# Patient Record
Sex: Male | Born: 1970
Health system: Southern US, Community
[De-identification: ages and names within clinical notes are randomized; demographics above are authoritative.]

## PROBLEM LIST (undated history)

## (undated) DIAGNOSIS — E785 Hyperlipidemia, unspecified: Secondary | ICD-10-CM

## (undated) DIAGNOSIS — F419 Anxiety disorder, unspecified: Secondary | ICD-10-CM

## (undated) DIAGNOSIS — I10 Essential (primary) hypertension: Secondary | ICD-10-CM

## (undated) HISTORY — DX: Hyperlipidemia, unspecified: E78.5

## (undated) HISTORY — DX: Essential (primary) hypertension: I10

## (undated) HISTORY — DX: Anxiety disorder, unspecified: F41.9

## (undated) HISTORY — PX: ABLATION OF DYSRHYTHMIC FOCUS: SHX254

---

## 1998-08-09 HISTORY — PX: OTHER SURGICAL HISTORY: SHX169

## 2004-02-23 ENCOUNTER — Ambulatory Visit: Payer: Self-pay | Admitting: Internal Medicine

## 2004-03-04 ENCOUNTER — Ambulatory Visit: Payer: Self-pay | Admitting: Family Medicine

## 2004-08-05 ENCOUNTER — Ambulatory Visit: Payer: Self-pay | Admitting: Family Medicine

## 2004-10-07 ENCOUNTER — Ambulatory Visit: Payer: Self-pay | Admitting: Family Medicine

## 2005-04-07 ENCOUNTER — Ambulatory Visit: Payer: Self-pay | Admitting: Family Medicine

## 2005-10-06 ENCOUNTER — Ambulatory Visit: Payer: Self-pay | Admitting: Family Medicine

## 2006-01-10 ENCOUNTER — Ambulatory Visit: Payer: Self-pay | Admitting: Family Medicine

## 2006-07-06 ENCOUNTER — Ambulatory Visit: Payer: Self-pay | Admitting: Family Medicine

## 2006-07-06 LAB — CONVERTED CEMR LAB
AST: 21 units/L (ref 0–37)
Bilirubin, Direct: 0.1 mg/dL (ref 0.0–0.3)
Chloride: 106 meq/L (ref 96–112)
Eosinophils Absolute: 0.2 10*3/uL (ref 0.0–0.6)
Eosinophils Relative: 2.2 % (ref 0.0–5.0)
GFR calc non Af Amer: 135 mL/min
Glucose, Bld: 91 mg/dL (ref 70–99)
HCT: 46.5 % (ref 39.0–52.0)
Lymphocytes Relative: 18.4 % (ref 12.0–46.0)
MCV: 100.8 fL — ABNORMAL HIGH (ref 78.0–100.0)
Neutro Abs: 5.9 10*3/uL (ref 1.4–7.7)
Neutrophils Relative %: 69.7 % (ref 43.0–77.0)
RBC: 4.61 M/uL (ref 4.22–5.81)
Sodium: 143 meq/L (ref 135–145)
TSH: 0.9 microintl units/mL (ref 0.35–5.50)
Total Protein: 7.1 g/dL (ref 6.0–8.3)
WBC: 8.5 10*3/uL (ref 4.5–10.5)

## 2006-12-03 DIAGNOSIS — F17200 Nicotine dependence, unspecified, uncomplicated: Secondary | ICD-10-CM

## 2006-12-03 DIAGNOSIS — I10 Essential (primary) hypertension: Secondary | ICD-10-CM | POA: Insufficient documentation

## 2006-12-03 DIAGNOSIS — E785 Hyperlipidemia, unspecified: Secondary | ICD-10-CM | POA: Insufficient documentation

## 2006-12-03 DIAGNOSIS — Z720 Tobacco use: Secondary | ICD-10-CM | POA: Insufficient documentation

## 2006-12-03 DIAGNOSIS — F528 Other sexual dysfunction not due to a substance or known physiological condition: Secondary | ICD-10-CM | POA: Insufficient documentation

## 2006-12-03 DIAGNOSIS — F411 Generalized anxiety disorder: Secondary | ICD-10-CM | POA: Insufficient documentation

## 2007-04-08 ENCOUNTER — Ambulatory Visit: Payer: Self-pay | Admitting: Family Medicine

## 2007-05-10 ENCOUNTER — Ambulatory Visit: Payer: Self-pay | Admitting: Family Medicine

## 2007-07-05 ENCOUNTER — Encounter (INDEPENDENT_AMBULATORY_CARE_PROVIDER_SITE_OTHER): Payer: Self-pay | Admitting: *Deleted

## 2007-07-09 ENCOUNTER — Encounter (INDEPENDENT_AMBULATORY_CARE_PROVIDER_SITE_OTHER): Payer: Self-pay | Admitting: *Deleted

## 2007-08-16 ENCOUNTER — Ambulatory Visit: Payer: Self-pay | Admitting: Family Medicine

## 2007-08-19 ENCOUNTER — Encounter (INDEPENDENT_AMBULATORY_CARE_PROVIDER_SITE_OTHER): Payer: Self-pay | Admitting: *Deleted

## 2007-08-19 ENCOUNTER — Telehealth (INDEPENDENT_AMBULATORY_CARE_PROVIDER_SITE_OTHER): Payer: Self-pay | Admitting: Internal Medicine

## 2007-08-19 LAB — CONVERTED CEMR LAB
BUN: 5 mg/dL — ABNORMAL LOW (ref 6–23)
CO2: 30 meq/L (ref 19–32)
Chloride: 106 meq/L (ref 96–112)
Direct LDL: 143.8 mg/dL
Glucose, Bld: 95 mg/dL (ref 70–99)
HDL: 37.5 mg/dL — ABNORMAL LOW (ref >39.0)
Potassium: 4.1 meq/L (ref 3.5–5.1)

## 2007-09-16 ENCOUNTER — Telehealth (INDEPENDENT_AMBULATORY_CARE_PROVIDER_SITE_OTHER): Payer: Self-pay | Admitting: Internal Medicine

## 2008-02-27 ENCOUNTER — Telehealth (INDEPENDENT_AMBULATORY_CARE_PROVIDER_SITE_OTHER): Payer: Self-pay | Admitting: Internal Medicine

## 2008-04-17 ENCOUNTER — Ambulatory Visit: Payer: Self-pay | Admitting: Family Medicine

## 2008-04-27 LAB — CONVERTED CEMR LAB
ALT: 16 units/L (ref 0–53)
Albumin: 4 g/dL (ref 3.5–5.2)
BUN: 13 mg/dL (ref 6–23)
Calcium: 9.2 mg/dL (ref 8.4–10.5)
Cholesterol: 187 mg/dL (ref 0–200)
GFR calc Af Amer: 107 mL/min
Glucose, Bld: 89 mg/dL (ref 70–99)
HDL: 41.7 mg/dL (ref >39.0)
Total Protein: 6.8 g/dL (ref 6.0–8.3)
Triglycerides: 104 mg/dL (ref 0–149)
VLDL: 21 mg/dL (ref 0–40)

## 2008-04-30 ENCOUNTER — Telehealth (INDEPENDENT_AMBULATORY_CARE_PROVIDER_SITE_OTHER): Payer: Self-pay | Admitting: Internal Medicine

## 2008-10-16 ENCOUNTER — Ambulatory Visit: Payer: Self-pay | Admitting: Family Medicine

## 2009-06-24 ENCOUNTER — Telehealth: Payer: Self-pay | Admitting: Family Medicine

## 2009-06-25 ENCOUNTER — Ambulatory Visit: Payer: Self-pay | Admitting: Family Medicine

## 2009-07-01 LAB — CONVERTED CEMR LAB
AST: 25 units/L (ref 0–37)
Albumin: 4.1 g/dL (ref 3.5–5.2)
BUN: 9 mg/dL (ref 6–23)
CO2: 30 meq/L (ref 19–32)
Calcium: 9.3 mg/dL (ref 8.4–10.5)
Direct LDL: 154.8 mg/dL
GFR calc non Af Amer: 113.47 mL/min (ref >60)
Glucose, Bld: 91 mg/dL (ref 70–99)
HDL: 56.8 mg/dL (ref >39.00)
Potassium: 4.4 meq/L (ref 3.5–5.1)
Total Protein: 7.3 g/dL (ref 6.0–8.3)
Triglycerides: 86 mg/dL (ref 0.0–149.0)

## 2009-09-24 ENCOUNTER — Ambulatory Visit: Payer: Self-pay | Admitting: Family Medicine

## 2009-09-24 LAB — CONVERTED CEMR LAB
ALT: 20 units/L (ref 0–53)
AST: 24 units/L (ref 0–37)
HDL: 55 mg/dL (ref >39.00)

## 2009-11-11 ENCOUNTER — Encounter (INDEPENDENT_AMBULATORY_CARE_PROVIDER_SITE_OTHER): Payer: Self-pay | Admitting: *Deleted

## 2009-12-24 ENCOUNTER — Telehealth: Payer: Self-pay | Admitting: Family Medicine

## 2010-02-04 ENCOUNTER — Ambulatory Visit: Payer: Self-pay | Admitting: Family Medicine

## 2010-03-18 ENCOUNTER — Ambulatory Visit: Payer: Self-pay | Admitting: Family Medicine

## 2010-03-21 LAB — CONVERTED CEMR LAB
ALT: 26 units/L (ref 0–53)
AST: 26 units/L (ref 0–37)
Albumin: 4.2 g/dL (ref 3.5–5.2)
Cholesterol: 210 mg/dL — ABNORMAL HIGH (ref 0–200)
Direct LDL: 143.4 mg/dL
HDL: 47.3 mg/dL (ref >39.00)
Total Bilirubin: 0.6 mg/dL (ref 0.3–1.2)
Total Protein: 7.2 g/dL (ref 6.0–8.3)
Triglycerides: 144 mg/dL (ref 0.0–149.0)

## 2010-05-10 NOTE — Assessment & Plan Note (Signed)
Summary: 30 MIN. F/U/BILLIE'S PT/CLE   Vital Signs:  Patient profile:   40 year old male Height:      70.5 inches Weight:      164.25 pounds BMI:     23.32 Temp:     98.1 degrees F oral Pulse rate:   80 / minute Pulse rhythm:   regular BP sitting:   142 / 80  (left arm) Cuff size:   regular  Vitals Entered By: Delilah Shan CMA Duncan Dull) (June 25, 2009 9:02 AM) CC: 30 minute follow up (BDB)   History of Present Illness: 40 yo male new to me here for follow up HTN.  HTN- mildly elevated today.  Has not taken medications yet today.  On Quinaretic 20-12.5 mg daily and propranolol 80 mg - 3 tabs tid.  Not having any side effects.  No CP, SOB, HA, lower extremity edema, fatigue, or dizziness.  No blurred vision.  ED- has been on Levitra for years.  He is aware of possible interaction with beta blocker, never had any issues.  Current Medications (verified): 1)  Propranolol Hcl Cr 80 Mg  Cp24 (Propranolol Hcl) .... 3 Tabs Two Times A Day 2)  Quinaretic 20-12.5 Mg  Tabs (Quinapril-Hydrochlorothiazide) .... 2 Tablets Daily By Mouth in Am 3)  Levitra 20 Mg Tabs (Vardenafil Hcl) .Marland Kitchen.. 1 Tablet  Pior To Intercourse 4)  Aspirin 325 Mg  Tabs (Aspirin) .... One Daily 5)  Vitamin C 500 Mg  Tabs (Ascorbic Acid) .... One Daily  Allergies (verified): No Known Drug Allergies  Review of Systems      See HPI General:  Denies chills and fever. CV:  Denies chest pain or discomfort. Resp:  Denies shortness of breath. MS:  Denies muscle aches. Derm:  Denies rash. Psych:  Denies anxiety and depression.  Physical Exam  General:  alert, well-developed, well-nourished, and well-hydrated.   Mouth:  MMM Lungs:  normal respiratory effort, no intercostal retractions, no accessory muscle use, and normal breath sounds.   Heart:  normal rate, regular rhythm, and no murmur.   Extremities:  no edema either lower leg Neurologic:  alert & oriented X3 and gait normal.   Psych:  normally interactive and good  eye contact.     Impression & Recommendations:  Problem # 1:  HYPERTENSION (ICD-401.9) Assessment Deteriorated Mildly elevated today but has not yet taken meds.  Continue current meds.  Reviewed old records as he on an unusual regimen, appears to be difficult to control. His updated medication list for this problem includes:    Propranolol Hcl Cr 80 Mg Cp24 (Propranolol hcl) .Marland KitchenMarland KitchenMarland KitchenMarland Kitchen 3 tabs two times a day    Quinaretic 20-12.5 Mg Tabs (Quinapril-hydrochlorothiazide) .Marland Kitchen... 2 tablets daily by mouth in am  Orders: Venipuncture ([address]) TLB-BMP (Basic Metabolic Panel-BMET) (80048-METABOL)  Problem # 2:  ERECTILE DYSFUNCTION (ICD-302.72) Assessment: Unchanged Continue levitra, aware of interactions with betablockers. His updated medication list for this problem includes:    Levitra 20 Mg Tabs (Vardenafil hcl) .Marland Kitchen... 1 tablet  pior to intercourse  Complete Medication List: 1)  Propranolol Hcl Cr 80 Mg Cp24 (Propranolol hcl) .... 3 tabs two times a day 2)  Quinaretic 20-12.5 Mg Tabs (Quinapril-hydrochlorothiazide) .... 2 tablets daily by mouth in am 3)  Levitra 20 Mg Tabs (Vardenafil hcl) .Marland Kitchen.. 1 tablet  pior to intercourse 4)  Aspirin 325 Mg Tabs (Aspirin) .... One daily 5)  Vitamin C 500 Mg Tabs (Ascorbic acid) .... One daily  Other Orders: TLB-Lipid Panel (80061-LIPID) TLB-Hepatic/Liver Function  Pnl (80076-HEPATIC) Prescriptions: LEVITRA 20 MG TABS (VARDENAFIL HCL) 1 tablet  pior to intercourse  #12 x 0   Entered and Authorized by:   Ruthe Mannan MD   Signed by:   Ruthe Mannan MD on 06/25/2009   Method used:   Electronically to        [facility] [address] (retail)       [address]       [place], [place]  [address]       Ph: [phone]       Fax: [phone]   RxID:   [id number] PROPRANOLOL HCL CR 80 MG  CP24 (PROPRANOLOL HCL) 3 tabs two times a day  #180 x 6   Entered and Authorized by:   Ruthe Mannan MD   Signed by:   Ruthe Mannan MD on  06/25/2009   Method used:   Electronically to        [facility]* (retail)       [address]       [place], [place]  [address]       Ph: [phone]       Fax: [phone]   RxID:   [id number] QUINARETIC 20-12.5 MG  TABS (QUINAPRIL-HYDROCHLOROTHIAZIDE) 2 tablets daily by mouth in am  #60 x 6   Entered and Authorized by:   Ruthe Mannan MD   Signed by:   Ruthe Mannan MD on 06/25/2009   Method used:   Electronically to        [facility]* (retail)       [address]       [place], [place]  [address]       Ph: [phone]       Fax: [phone]   RxID:   [id number]   Current Allergies (reviewed today): No known allergies   Last HDL:  41.7 (04/17/2008 8:44:00 AM) HDL Next Due:  1 yr Last LDL:  125 (04/17/2008 8:44:00 AM) LDL Next Due:  1 yr

## 2010-05-10 NOTE — Letter (Signed)
Summary: Nadara Eaton letter  [facility]  [address] [place], [place] [address]   Phone: [phone]  Fax: [phone]       11/11/2009 MRN: [id number]  RISHI VICARIO [address] [place] [address] [place], [place]  [address]  Dear Mr. [facility] [place], and [place] announce the retirement of Arta Silence, M.D., from full-time practice at the [facility] office effective October 07, 2009 and his plans of returning part-time.  It is important to Dr. Hetty Ely and to our practice that you understand that [facility] has seven physicians in our office for your health care needs.  We will continue to offer the same exceptional care that you have today.    Dr. Hetty Ely has spoken to many of you about his plans for retirement and returning part-time in the fall.   We will continue to work with you through the transition to schedule appointments for you in the office and meet the high standards that [facility] [facility] is committed to.   Again, it is with great pleasure that we share the news that Dr. Hetty Ely will return to [facility] at [facility] in October of 2011 with a reduced schedule.    If you have any questions, or would like to request an appointment with one of our physicians, please call us at [phone] and press the option for Scheduling an appointment.  We take pleasure in providing you with excellent patient care and look forward to seeing you at your next office visit.  Our [facility] Physicians are:  Tillman Abide, M.D. Laurita Quint, M.D. Roxy Manns, M.D. Kerby Nora, M.D. Hannah Beat, M.D. Ruthe Mannan, M.D. We proudly welcomed Raechel Ache, M.D. and Eustaquio Boyden, M.D. to the practice in July/August 2011.  Sincerely,  [facility]

## 2010-05-10 NOTE — Progress Notes (Signed)
Summary: refill request for levitra  Phone Note Refill Request Message from:  Fax from Pharmacy  Refills Requested: Medication #1:  LEVITRA 20 MG TABS 1 tablet  pior to intercourse   Last Refilled: 09/14/2009 [facility] [address] [facility]  Initial call taken by: Lowella Petties CMA,  December 24, 2009 4:51 PM    Prescriptions: LEVITRA 20 MG TABS (VARDENAFIL HCL) 1 tablet  pior to intercourse  #12 x 0   Entered and Authorized by:   Ruthe Mannan MD   Signed by:   Ruthe Mannan MD on 12/26/2009   Method used:   Electronically to        [facility] [address] (retail)       [address]       [place], [place]  [address]       Ph: [phone]       Fax: [phone]   RxID:   [id number]

## 2010-05-10 NOTE — Letter (Signed)
Summary: Schaller Retirement letter  [facility] [place]  [address]   [place], [place] [address]   Phone: [phone]  Fax: [phone]       11/11/2009 MRN: [id number]  Suhaib Malter [address] [place], [place]  [address]  Dear Mr. Talamantez,  [facility] [place], and  announce the retirement of ROBERT N. SCHALLER, M.D., from full-time practice at the [place] office effective October 07, 2009 and his plans of returning part-time.  It is important to Dr. Schaller and to our practice that you understand that [facility] [place] has seven physicians in our office for your health care needs.  We will continue to offer the same exceptional care that you have today.    Dr. Schaller has spoken to many of you about his plans for retirement and returning part-time in the fall.   We will continue to work with you through the transition to schedule appointments for you in the office and meet the high standards that [facility] is committed to.   Again, it is with great pleasure that we share the news that Dr. Schaller will return to [facility] [place] in October of 2011 with a reduced schedule.    If you have any questions, or would like to request an appointment with one of our physicians, please call us at [phone] and press the option for Scheduling an appointment.  We take pleasure in providing you with excellent patient care and look forward to seeing you at your next office visit.  Our [place] Physicians are:  Richard Letvak, M.D. Robert Schaller, M.D. Marne Tower, M.D. Ajooni Karam Bedsole, M.D. Spencer Copland, M.D. Talia Aron, M.D. We proudly welcomed Shaw Duncan, M.D. and Javier Gutierrez, M.D. to the practice in July/August 2011.  Sincerely,  [facility] [place] 

## 2010-05-10 NOTE — Progress Notes (Signed)
Summary: Rx Levitra   Phone Note Refill Request Call back at [phone] Message from:  [facility] on June 24, 2009 9:26 AM  Refills Requested: Medication #1:  LEVITRA 20 MG TABS 1 tablet  pior to intercourse   Last Refilled: 04/02/2009 Received faxed refill request, patient has an appt with Dr. Dayton Martes on 06/25/2009.  Please advise.   Method Requested: Electronic Initial call taken by: Linde Gillis CMA Duncan Dull),  June 24, 2009 9:27 AM    Prescriptions: LEVITRA 20 MG TABS (VARDENAFIL HCL) 1 tablet  pior to intercourse  #12 x 0   Entered and Authorized by:   Ruthe Mannan MD   Signed by:   Linde Gillis CMA (AAMA) on 06/24/2009   Method used:   Electronically to        [facility]* (retail)       [address]       [place], [place]  [address]       Ph: [phone]       Fax: [phone]   RxID:   [id number] LEVITRA 20 MG TABS (VARDENAFIL HCL) 1 tablet  pior to intercourse  #12 x 0   Entered and Authorized by:   Ruthe Mannan MD   Signed by:   Ruthe Mannan MD on 06/24/2009   Method used:   Electronically to        [facility]* (retail)       [address]       [place], [place]  [address]       Ph: [phone]       Fax: [phone]   RxID:   [id number]

## 2010-05-10 NOTE — Assessment & Plan Note (Signed)
Summary: F/U/DLO   Vital Signs:  Patient profile:   40 year old male Height:      70.5 inches Weight:      160 pounds Temp:     98.6 degrees F oral Pulse rate:   80 / minute Pulse rhythm:   regular BP sitting:   140 / 90  (left arm) Cuff size:   regular  Vitals Entered By: Linde Gillis CMA Duncan Dull) (February 04, 2010 11:47 AM) CC: follow up, wants flu shot   History of Present Illness: 40 yo male here for follow up HTN.  HTN- mildly elevated today.  Has not taken medications yet today.  On Quinaretic 20-12.5 mg daily and propranolol 80 mg - 3 tabs tid.  Not having any side effects.  No CP, SOB, HA, lower extremity edema, fatigue, or dizziness.  No blurred vision.  ED- has been on Levitra for years.  He is aware of possible interaction with beta blocker, never had any issues.  HLD-  LDL was elevated to 154 in 06/2009.  Pt aware of his risk factors and knows that smoking negatively impacts his cholesterol.  Worked on his diet- limited fried foods and saturated fats.  Repeat lipid panel much improve in 09/2009- LDL was 138, TG and HDL were normal.    Current Medications (verified): 1)  Propranolol Hcl Cr 80 Mg  Cp24 (Propranolol Hcl) .... 3 Tabs Two Times A Day 2)  Quinaretic 20-12.5 Mg  Tabs (Quinapril-Hydrochlorothiazide) .... 2 Tablets Daily By Mouth in Am 3)  Levitra 20 Mg Tabs (Vardenafil Hcl) .Marland Kitchen.. 1 Tablet  Pior To Intercourse 4)  Aspirin 325 Mg  Tabs (Aspirin) .... One Daily 5)  Vitamin C 500 Mg  Tabs (Ascorbic Acid) .... One Daily  Allergies (verified): No Known Drug Allergies  Past History:  Past Medical History: Last updated: 12/03/2006 Hypertension Anxiety Hyperlipidemia  Past Surgical History: Last updated: 12/03/2006 renal u/s-- nml--5/00 focus neg  Family History: Last updated: 04/08/2007 Father: 62--back problems, colon polyps Mother: 60--HBP, ?elevated lipids, fibromyalgia Siblings: none    DM-0 MI- 0 CVA- 0 Prostate Cancer- 0 Breast Cancer-  Paunt Ovarian Cancer- 0 Uterine Cancer- 0 Colon Cancer-0 Drug/ ETOH Abuse-0 Depression- 0 HBP--0  Social History: Last updated: 04/08/2007 Marital Status: single, divorced Children: 2 daughters--16 and 13 Occupation: makes cigarette boxes--machine operater  Risk Factors: Alcohol Use: 2 (04/08/2007) Caffeine Use: 2 (04/17/2008) Exercise: yes (04/08/2007)  Risk Factors: Smoking Status: current (10/16/2008) Packs/Day: 4 cigs daily (10/16/2008) Cans of tobacco/wk: no (04/08/2007)  Review of Systems      See HPI General:  Denies malaise. Eyes:  Denies blurring. CV:  Denies chest pain or discomfort. Resp:  Denies shortness of breath.  Physical Exam  General:  alert, well-developed, well-nourished, and well-hydrated.   Head:  normocephalic and atraumatic.   Eyes:  vision grossly intact, pupils equal, pupils round, and pupils reactive to light.   Nose:  no external deformity.   Mouth:  good dentition.   Lungs:  normal respiratory effort, no intercostal retractions, no accessory muscle use, and normal breath sounds.   Heart:  normal rate, regular rhythm, and no murmur.   Extremities:  no edema either lower leg Psych:  normally interactive and good eye contact.     Impression & Recommendations:  Problem # 1:  HYPERTENSION (ICD-401.9) Assessment Deteriorated mildly elevated, advised to take his medicaiton when he gets home. Refilled meds as well. His updated medication list for this problem includes:    Propranolol Hcl Cr  80 Mg Cp24 (Propranolol hcl) .Marland KitchenMarland KitchenMarland KitchenMarland Kitchen 3 tabs two times a day    Quinaretic 20-12.5 Mg Tabs (Quinapril-hydrochlorothiazide) .Marland Kitchen... 2 tablets daily by mouth in am  Problem # 2:  HYPERLIPIDEMIA (ICD-272.4) Assessment: Improved Lab appointment scheduled for recheck lipids on 03/2010.  Complete Medication List: 1)  Propranolol Hcl Cr 80 Mg Cp24 (Propranolol hcl) .... 3 tabs two times a day 2)  Quinaretic 20-12.5 Mg Tabs (Quinapril-hydrochlorothiazide) .... 2  tablets daily by mouth in am 3)  Levitra 20 Mg Tabs (Vardenafil hcl) .Marland Kitchen.. 1 tablet  pior to intercourse 4)  Aspirin 325 Mg Tabs (Aspirin) .... One daily 5)  Vitamin C 500 Mg Tabs (Ascorbic acid) .... One daily  Patient Instructions: 1)  Great to see you. 2)  You have a lab appointment scheduled on 12/9 at 8:10 am.  Please come to this appointment fasting (no food or beverage other than water or black coffee after midnight). Prescriptions: LEVITRA 20 MG TABS (VARDENAFIL HCL) 1 tablet  pior to intercourse  #12 x 3   Entered and Authorized by:   Ruthe Mannan MD   Signed by:   Ruthe Mannan MD on 02/04/2010   Method used:   Electronically to        [facility]* (retail)       [address]       [place], [place]  [address]       Ph: [phone]       Fax: [phone]   RxID:   [id number] PROPRANOLOL HCL CR 80 MG  CP24 (PROPRANOLOL HCL) 3 tabs two times a day  #180 x 6   Entered and Authorized by:   Ruthe Mannan MD   Signed by:   Ruthe Mannan MD on 02/04/2010   Method used:   Electronically to        [facility]* (retail)       [address]       [place], [place]  [address]       Ph: [phone]       Fax: [phone]   RxID:   [id number] QUINARETIC 20-12.5 MG  TABS (QUINAPRIL-HYDROCHLOROTHIAZIDE) 2 tablets daily by mouth in am  #90 x 6   Entered and Authorized by:   Ruthe Mannan MD   Signed by:   Ruthe Mannan MD on 02/04/2010   Method used:   Electronically to        [facility]* (retail)       [address]       [place], [place]  [address]       Ph: [phone]       Fax: [phone]   RxID:   [id number]    Orders Added: 1)  Est. Patient Level III [[address]]    Current Allergies (reviewed today): No known allergies   Flu Vaccine Result Date:  02/04/2010 Flu Vaccine Result:  given Last HDL:  55.00 (09/24/2009 8:23:55 AM) HDL Next Due:  6 mo Last LDL:  125 (04/17/2008 8:44:00 AM) LDL Next Due:  6 mo  Appended Document: F/U/DLO     Allergies: No  Known Drug Allergies   Complete Medication List: 1)  Propranolol Hcl Cr 80 Mg Cp24 (Propranolol hcl) .... 3 tabs two times a day 2)  Quinaretic 20-12.5 Mg Tabs (Quinapril-hydrochlorothiazide) .... 2 tablets daily by mouth in am 3)  Levitra 20 Mg Tabs (Vardenafil hcl) .Marland Kitchen.. 1 tablet  pior to intercourse 4)  Aspirin 325 Mg Tabs (Aspirin) .... One daily 5)  Vitamin C 500 Mg Tabs (  Ascorbic acid) .... One daily  Other Orders: Admin 1st Vaccine ([address]) Flu Vaccine 87yrs + [phone])   Orders Added: 1)  Admin 1st Vaccine [90471] 2)  Flu Vaccine 25yrs + [[address]]  Flu Vaccine Consent Questions     Do you have a history of severe allergic reactions to this vaccine? no    Any prior history of allergic reactions to egg and/or gelatin? no    Do you have a sensitivity to the preservative Thimersol? no    Do you have a past history of Guillan-Barre Syndrome? no    Do you currently have an acute febrile illness? no    Have you ever had a severe reaction to latex? no    Vaccine information given and explained to patient? yes    Are you currently pregnant? no    Lot Number:[id number]   Exp Date:10/08/2010   Site Given  Left Deltoid IM       .[email or web address]

## 2010-07-08 ENCOUNTER — Other Ambulatory Visit: Payer: Self-pay | Admitting: *Deleted

## 2010-07-08 MED ORDER — PROPRANOLOL HCL ER 80 MG PO CP24: ORAL_CAPSULE | ORAL | Status: DC

## 2010-08-18 ENCOUNTER — Other Ambulatory Visit: Payer: Self-pay | Admitting: *Deleted

## 2010-08-18 MED ORDER — VARDENAFIL HCL 20 MG PO TABS: ORAL_TABLET | ORAL | Status: DC

## 2010-08-26 NOTE — Assessment & Plan Note (Signed)
[facility]                                 ON-CALL NOTE   ANUSH, WIEDEMAN                      MRN:          [id number]  DATE:06/22/2007                            DOB:          1970/05/15    TIME:  5:55   SUBJECTIVE:  Ran out of blood pressure medicine.  Went to [facility] to have it refilled, but they are closed at 1, so he is  requesting a prescription sent to a pharmacy that is open to last him  through the weekend.   ASSESSMENT AND PLAN:  Prescription for Propranolol 80 mg, 3 tablets p.o.  b.i.d., #12, 0 refills, sent to [facility].     Kerby Nora, MD  Electronically Signed    AB/MedQ  DD: 06/22/2007  DT: 06/22/2007  Job #: [id number]

## 2010-09-16 ENCOUNTER — Ambulatory Visit: Payer: Self-pay | Admitting: Family Medicine

## 2010-10-07 ENCOUNTER — Encounter: Payer: Self-pay | Admitting: Family Medicine

## 2010-10-07 ENCOUNTER — Ambulatory Visit (INDEPENDENT_AMBULATORY_CARE_PROVIDER_SITE_OTHER): Payer: Managed Care, Other (non HMO) | Admitting: Family Medicine

## 2010-10-07 VITALS — BP 120/80 | HR 80 | Temp 98.0°F | Wt 147.5 lb

## 2010-10-07 DIAGNOSIS — I10 Essential (primary) hypertension: Secondary | ICD-10-CM

## 2010-10-07 DIAGNOSIS — E785 Hyperlipidemia, unspecified: Secondary | ICD-10-CM

## 2010-10-07 DIAGNOSIS — F411 Generalized anxiety disorder: Secondary | ICD-10-CM

## 2010-10-07 LAB — LIPID PANEL
HDL: 72 mg/dL (ref >39.00)
LDL Cholesterol: 80 mg/dL (ref 0–99)
Total CHOL/HDL Ratio: 2
Triglycerides: 68 mg/dL (ref 0.0–149.0)

## 2010-10-07 LAB — BASIC METABOLIC PANEL
CO2: 30 mEq/L (ref 19–32)
Calcium: 9.6 mg/dL (ref 8.4–10.5)
Chloride: 103 mEq/L (ref 96–112)
Sodium: 140 mEq/L (ref 135–145)

## 2010-10-07 NOTE — Progress Notes (Signed)
40 yo male here for follow up HTN.  HTN- Bp much improved today.  On Quinaretic 20-12.5 mg daily and propranolol 80 mg - 3 tabs tid.  Not having any side effects.  No CP, SOB, HA, lower extremity edema, fatigue, or dizziness.  No blurred vision. Lab Results  Component Value Date   CREATININE 0.8 06/25/2009    ED- has been on Levitra for years.  He is aware of possible interaction with beta blocker, never had any issues.  HLD-  LDL was elevated to 154 in 06/2009.  Pt aware of his risk factors and knows that smoking negatively impacts his cholesterol.  Worked on his diet- limited fried foods and saturated fats.  Repeat lipid panel much improve in 09/2009- LDL was 138, TG and HDL were normal.   Patient Active Problem List  Diagnoses  . HYPERLIPIDEMIA  . ANXIETY  . ERECTILE DYSFUNCTION  . NICOTINE ADDICTION  . HYPERTENSION   Past Medical History  Diagnosis Date  . Hypertension   . Anxiety   . Hyperlipidemia    Past Surgical History  Procedure Date  . Ablation of dysrhythmic focus     negative  . Renal ultrasound 08/1998    normal   History  Substance Use Topics  . Smoking status: Not on file  . Smokeless tobacco: Not on file  . Alcohol Use:    Family History  Problem Relation Age of Onset  . Hypertension Mother   . Hyperlipidemia Mother   . Fibromyalgia Mother   . Colon polyps Father   . Cancer Paternal Aunt     breast   Allergies not on file Current Outpatient Prescriptions on File Prior to Visit  Medication Sig Dispense Refill  . propranolol (INDERAL LA) 80 MG 24 hr capsule Take 3 capsules by mouth 2 times daily  180 capsule  2  . vardenafil (LEVITRA) 20 MG tablet 1 tablet  pior to intercourse  12 tablet  3     Review of Systems       See HPI General:  Denies malaise. Eyes:  Denies blurring. CV:  Denies chest pain or discomfort. Resp:  Denies shortness of breath.  Physical Exam BP 120/80  Pulse 80  Temp(Src) 98 F (36.7 C) (Oral)  Wt 147 lb 8 oz (66.906  kg)  General:  alert, well-developed, well-nourished, and well-hydrated.   Head:  normocephalic and atraumatic.   Eyes:  vision grossly intact, pupils equal, pupils round, and pupils reactive to light.   Nose:  no external deformity.   Mouth:  good dentition.   Lungs:  normal respiratory effort, no intercostal retractions, no accessory muscle use, and normal breath sounds.   Heart:  normal rate, regular rhythm, and no murmur.   Extremities:  no edema either lower leg Psych:  normally interactive and good eye contact.    1. HYPERTENSION  Improved. Continue Quinaretic 20-12.5 mg and propranolol 80 mg daily. Recheck BMET today.  2. HYPERLIPIDEMIA - Unchanged.  Recheck lipid panel today.   3. ANXIETY  Stable.

## 2010-10-07 NOTE — Patient Instructions (Signed)
Great to see you!   

## 2010-10-28 ENCOUNTER — Other Ambulatory Visit: Payer: Self-pay | Admitting: *Deleted

## 2010-10-28 MED ORDER — QUINAPRIL-HYDROCHLOROTHIAZIDE 20-12.5 MG PO TABS: 2.0000 | ORAL_TABLET | ORAL | Status: DC

## 2010-12-20 ENCOUNTER — Other Ambulatory Visit: Payer: Self-pay | Admitting: *Deleted

## 2010-12-20 NOTE — Telephone Encounter (Signed)
Yes it is ok but he needs to keep a close check on his BP.

## 2010-12-20 NOTE — Telephone Encounter (Signed)
Left message on pharmacy voicemail authorizing the change and for them to please not advise patient that he needs to keep close check on his BP.  Left a message on machine at home for patient to return call.

## 2010-12-20 NOTE — Telephone Encounter (Signed)
John Peters called back from [facility] and stated that they need new directions for Propranolol.  Patient will be going from extended release which he takes twice daily to immediate release which may need to be three times daily.   Please advise.

## 2010-12-20 NOTE — Telephone Encounter (Signed)
Received faxed from pharmacy requesting if they can change Generic Inderal LA which is $120 to IR Propranolol to decrease the cost?  Please advise.

## 2010-12-21 ENCOUNTER — Telehealth: Payer: Self-pay | Admitting: *Deleted

## 2010-12-21 NOTE — Telephone Encounter (Signed)
Patients dad called regarding patients Inderal Rx.  I advised him that we recently changed from extended release to immediate release.  Patients concern is that his insurance will no longer cover the cost of the Inderal Rx.  I advised dad to have patient call [facility] and find out how much the Rx would be not that we have changed it from XR to IR and let us know if he is still having trouble paying for the medication.  Dad will have patient call pharmacy and contact us for any additional questions regarding his medications.

## 2010-12-21 NOTE — Telephone Encounter (Signed)
Currently he should be taking Propranolol 80 mg- 3 capsules twice daily. Why don't we first try 2 capsules three times daily and have him come in for BP check on Friday.

## 2010-12-21 NOTE — Telephone Encounter (Signed)
Noted  

## 2010-12-21 NOTE — Telephone Encounter (Signed)
Rx sent to called to [facility], called patient and left a message on machine at home for patient to return call.

## 2011-02-16 ENCOUNTER — Telehealth: Payer: Self-pay | Admitting: *Deleted

## 2011-02-16 MED ORDER — LISINOPRIL-HYDROCHLOROTHIAZIDE 20-12.5 MG PO TABS: 1.0000 | ORAL_TABLET | Freq: Every day | ORAL | Status: DC

## 2011-02-16 NOTE — Telephone Encounter (Signed)
Yes.  rx sent.

## 2011-02-16 NOTE — Telephone Encounter (Signed)
Pt is asking if he can change from quinipril to lisinopril because of the cost.  Please advise.  Uses [facility].

## 2011-02-17 NOTE — Telephone Encounter (Signed)
Patient advised as instructed via telephone, Lisinopril sent to [facility].

## 2011-02-20 ENCOUNTER — Other Ambulatory Visit: Payer: Self-pay | Admitting: *Deleted

## 2011-02-20 MED ORDER — PROPRANOLOL HCL 80 MG PO TABS: ORAL_TABLET | ORAL | Status: DC

## 2011-02-20 NOTE — Telephone Encounter (Signed)
Rx filled electronically  °

## 2011-09-25 ENCOUNTER — Other Ambulatory Visit: Payer: Self-pay | Admitting: Family Medicine

## 2011-10-20 ENCOUNTER — Ambulatory Visit (INDEPENDENT_AMBULATORY_CARE_PROVIDER_SITE_OTHER): Payer: Managed Care, Other (non HMO) | Admitting: Family Medicine

## 2011-10-20 ENCOUNTER — Encounter: Payer: Self-pay | Admitting: Family Medicine

## 2011-10-20 VITALS — BP 142/82 | HR 84 | Temp 97.9°F | Wt 153.0 lb

## 2011-10-20 DIAGNOSIS — M6740 Ganglion, unspecified site: Secondary | ICD-10-CM

## 2011-10-20 MED ORDER — VARDENAFIL HCL 20 MG PO TABS: ORAL_TABLET | ORAL | Status: DC

## 2011-10-20 MED ORDER — PROPRANOLOL HCL 80 MG PO TABS: ORAL_TABLET | ORAL | Status: DC

## 2011-10-20 MED ORDER — LISINOPRIL-HYDROCHLOROTHIAZIDE 20-12.5 MG PO TABS: 1.0000 | ORAL_TABLET | Freq: Every day | ORAL | Status: DC

## 2011-10-20 NOTE — Progress Notes (Signed)
  Subjective:    Patient ID: John Peters, male    DOB: November 26, 1970, 41 y.o.   MRN: [id number]  HPI  41 yo here for lump on dorsal surface of his right hand. Not painful or red. Has been there for several weeks.  He is not sure if it is getting larger, but he is sure it is not getting smaller. No tingling or pain in digits. No decreased grip strength. Never had anything like this before.  Patient Active Problem List  Diagnosis  . HYPERLIPIDEMIA  . ANXIETY  . ERECTILE DYSFUNCTION  . NICOTINE ADDICTION  . HYPERTENSION  . Ganglion cyst   Past Medical History  Diagnosis Date  . Hypertension   . Anxiety   . Hyperlipidemia    Past Surgical History  Procedure Date  . Ablation of dysrhythmic focus     negative  . Renal ultrasound 08/1998    normal   History  Substance Use Topics  . Smoking status: Current Everyday Smoker  . Smokeless tobacco: Not on file  . Alcohol Use: Not on file   Family History  Problem Relation Age of Onset  . Hypertension Mother   . Hyperlipidemia Mother   . Fibromyalgia Mother   . Colon polyps Father   . Cancer Paternal Aunt     breast   No Known Allergies Current Outpatient Prescriptions on File Prior to Visit  Medication Sig Dispense Refill  . aspirin 325 MG tablet Take 325 mg by mouth daily.        . vitamin C (ASCORBIC ACID) 500 MG tablet Take 500 mg by mouth daily.        Marland Kitchen DISCONTD: lisinopril-hydrochlorothiazide (ZESTORETIC) 20-12.5 MG per tablet Take 1 tablet by mouth daily.  30 tablet  11  . DISCONTD: propranolol (INDERAL) 80 MG tablet TAKE 2 TABLETS BY MOUTH THREE TIMES A   DAY  180 tablet  0  . DISCONTD: vardenafil (LEVITRA) 20 MG tablet 1 tablet  pior to intercourse  12 tablet  3   The PMH, PSH, Social History, Family History, Medications, and allergies have been reviewed in [facility], and have been updated if relevant.   Review of Systems    See HPI No fever  Objective:   Physical Exam  BP 142/82  Pulse 84  Temp 97.9 F  (36.6 C)  Wt 153 lb (69.4 kg) General:  Pleasant male in NAD HEENT:normocephalic, atraumatic Extremities:  no edema   Freely movable nodule on dorsal aspect of right hand, non painful to palpation, no erythema or warmth Assessment & Plan:   1. Ganglion cyst  Ambulatory referral to Orthopedic Surgery   New- will refer to ortho for removal. The patient indicates understanding of these issues and agrees with the plan.

## 2011-10-20 NOTE — Patient Instructions (Addendum)
Good to see you. You have a cyst. Please stop by to see Shirlee Limerick on your way out to set up your referral.

## 2012-03-08 NOTE — Telephone Encounter (Signed)
Made in error

## 2012-05-28 ENCOUNTER — Other Ambulatory Visit: Payer: Self-pay | Admitting: *Deleted

## 2012-05-28 MED ORDER — PROPRANOLOL HCL 80 MG PO TABS: ORAL_TABLET | ORAL | Status: DC

## 2012-05-30 ENCOUNTER — Telehealth: Payer: Self-pay | Admitting: Family Medicine

## 2012-05-30 NOTE — Telephone Encounter (Signed)
John Peters  DOB- 1970-09-12, PCP- Ruthe Mannan (Family Practice)/  Calling in requesting a refill on (#1) Lisinopril HCTZ  20-12.5mg  take one tablet daily  (#2) Refill on Propanolol/Inderal 80mg  PLEASE VERIFY THE DOSAGE.  Patient states he takes 2 tablets in the morning and 2 tablets at night.  Epic review -conflicting - 2 tablets tid and  3 capsules by mouth bid ??  Unsure which is correct.  LOV 10/20/2011.  Patient needs an appointment and transferred to the office to schedule Physical  appointment.  Please contact patient regarding refill of medication- [phone].  [facility].

## 2012-05-30 NOTE — Telephone Encounter (Signed)
Please see note below.  Spoke with patient, he says he takes 2 propranolol two times a day.  States his blood pressure is fine, he monitors it at home.  Please advise on how he should be taking this.

## 2012-05-31 MED ORDER — LISINOPRIL-HYDROCHLOROTHIAZIDE 20-12.5 MG PO TABS: 1.0000 | ORAL_TABLET | Freq: Every day | ORAL | Status: DC

## 2012-05-31 MED ORDER — PROPRANOLOL HCL 80 MG PO TABS: ORAL_TABLET | ORAL | Status: DC

## 2012-05-31 NOTE — Telephone Encounter (Signed)
Please take 2 propranolol twice daily if that is how he has been doing it and please update dose correctly in Epic.

## 2012-06-14 ENCOUNTER — Ambulatory Visit: Payer: Managed Care, Other (non HMO) | Admitting: Family Medicine

## 2012-06-28 ENCOUNTER — Other Ambulatory Visit: Payer: Self-pay

## 2012-06-28 MED ORDER — PROPRANOLOL HCL 80 MG PO TABS: ORAL_TABLET | ORAL | Status: DC

## 2012-06-28 MED ORDER — LISINOPRIL-HYDROCHLOROTHIAZIDE 20-12.5 MG PO TABS: 1.0000 | ORAL_TABLET | Freq: Every day | ORAL | Status: DC
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
## 2012-07-12 ENCOUNTER — Ambulatory Visit (INDEPENDENT_AMBULATORY_CARE_PROVIDER_SITE_OTHER): Payer: Managed Care, Other (non HMO) | Admitting: Family Medicine

## 2012-07-12 ENCOUNTER — Encounter: Payer: Self-pay | Admitting: Family Medicine

## 2012-07-12 VITALS — BP 122/90 | HR 60 | Temp 97.7°F | Ht 70.5 in | Wt 155.0 lb

## 2012-07-12 DIAGNOSIS — I10 Essential (primary) hypertension: Secondary | ICD-10-CM

## 2012-07-12 DIAGNOSIS — F528 Other sexual dysfunction not due to a substance or known physiological condition: Secondary | ICD-10-CM

## 2012-07-12 DIAGNOSIS — E785 Hyperlipidemia, unspecified: Secondary | ICD-10-CM

## 2012-07-12 LAB — LIPID PANEL
LDL Cholesterol: 114 mg/dL — ABNORMAL HIGH (ref 0–99)
Triglycerides: 113 mg/dL (ref <150)

## 2012-07-12 LAB — COMPREHENSIVE METABOLIC PANEL
Albumin: 4.4 g/dL (ref 3.5–5.2)
Alkaline Phosphatase: 59 U/L (ref 39–117)
CO2: 26 mEq/L (ref 19–32)
Calcium: 9.1 mg/dL (ref 8.4–10.5)
Chloride: 105 mEq/L (ref 96–112)
Glucose, Bld: 75 mg/dL (ref 70–99)
Potassium: 4.2 mEq/L (ref 3.5–5.3)
Sodium: 139 mEq/L (ref 135–145)
Total Protein: 7 g/dL (ref 6.0–8.3)

## 2012-07-12 MED ORDER — LISINOPRIL-HYDROCHLOROTHIAZIDE 20-12.5 MG PO TABS: 1.0000 | ORAL_TABLET | Freq: Every day | ORAL | Status: DC

## 2012-07-12 MED ORDER — PROPRANOLOL HCL 80 MG PO TABS: ORAL_TABLET | ORAL | Status: DC

## 2012-07-12 MED ORDER — VARDENAFIL HCL 20 MG PO TABS: ORAL_TABLET | ORAL | Status: DC

## 2012-07-12 NOTE — Progress Notes (Signed)
42 yo male with h/o HTN, HLD and ED here for follow up.    HTN-  On Quinaretic 20-12.5 mg daily and propranolol 80 mg - 3 tabs tid.  Not having any side effects.  No CP, SOB, HA, lower extremity edema, fatigue, or dizziness.  No blurred vision. Lab Results  Component Value Date   CREATININE 0.7 10/07/2010    ED- has been on Levitra for years.  He is aware of possible interaction with beta blocker, never had any issues.  HLD-  LDL was elevated to 154 in 06/2009.  Pt aware of his risk factors and knows that smoking negatively impacts his cholesterol. He has been trying e cigarettes and trying to cut back.  Also taking fish oil.  Continues to work on his diet and lipids have improved.  Due for labs today.    Lab Results  Component Value Date   CHOL 166 10/07/2010   HDL 72.00 10/07/2010   LDLCALC 80 10/07/2010   LDLDIRECT 143.4 03/18/2010   TRIG 68.0 10/07/2010   CHOLHDL 2 10/07/2010      Patient Active Problem List  Diagnosis  . HYPERLIPIDEMIA  . ANXIETY  . ERECTILE DYSFUNCTION  . NICOTINE ADDICTION  . HYPERTENSION   Past Medical History  Diagnosis Date  . Hypertension   . Anxiety   . Hyperlipidemia    Past Surgical History  Procedure Laterality Date  . Ablation of dysrhythmic focus      negative  . Renal ultrasound  08/1998    normal   History  Substance Use Topics  . Smoking status: Current Every Day Smoker  . Smokeless tobacco: Not on file  . Alcohol Use: Not on file   Family History  Problem Relation Age of Onset  . Hypertension Mother   . Hyperlipidemia Mother   . Fibromyalgia Mother   . Colon polyps Father   . Cancer Paternal Aunt     breast   No Known Allergies Current Outpatient Prescriptions on File Prior to Visit  Medication Sig Dispense Refill  . aspirin 325 MG tablet Take 325 mg by mouth daily.        Marland Kitchen lisinopril-hydrochlorothiazide (ZESTORETIC) 20-12.5 MG per tablet Take 1 tablet by mouth daily.  30 tablet  0  . propranolol (INDERAL) 80 MG tablet  Take 2 tablets by mouth two times a day. * Needs appointment with Dr for additional refills*  120 tablet  0  . vardenafil (LEVITRA) 20 MG tablet 1 tablet  pior to intercourse  12 tablet  5  . vitamin C (ASCORBIC ACID) 500 MG tablet Take 500 mg by mouth daily.         No current facility-administered medications on file prior to visit.     Review of Systems       See HPI General:  Denies malaise. CV:  Denies chest pain or discomfort. Resp:  Denies shortness of breath.  Physical Exam BP 122/90  Pulse 60  Temp(Src) 97.7 F (36.5 C)  Ht 5' 10.5" (1.791 m)  Wt 155 lb (70.308 kg)  BMI 21.92 kg/m2  General:  alert, well-developed, well-nourished, and well-hydrated.   Head:  normocephalic and atraumatic.   Eyes:  vision grossly intact, pupils equal, pupils round, and pupils reactive to light.   Nose:  no external deformity.   Mouth:  good dentition.   Lungs:  normal respiratory effort, no intercostal retractions, no accessory muscle use, and normal breath sounds.   Heart:  normal rate, regular rhythm, and  no murmur.   Extremities:  no edema either lower leg Psych:  normally interactive and good eye contact.    Assessment and Plan: 1. HYPERTENSION Stable on current meds.    2. HYPERLIPIDEMIA Recheck today. Orders Placed This Encounter  Procedures  . Comprehensive metabolic panel  . Lipid Panel     3. ERECTILE DYSFUNCTION Symptoms controlled with as needed levitra.

## 2012-07-12 NOTE — Patient Instructions (Addendum)
Good to see you. We will call you with your lab results. Your blood pressure was great!

## 2012-07-15 ENCOUNTER — Encounter: Payer: Self-pay | Admitting: Family Medicine

## 2013-03-19 ENCOUNTER — Other Ambulatory Visit: Payer: Self-pay | Admitting: *Deleted

## 2013-03-19 MED ORDER — LISINOPRIL-HYDROCHLOROTHIAZIDE 20-12.5 MG PO TABS: 1.0000 | ORAL_TABLET | Freq: Every day | ORAL | Status: DC

## 2013-03-22 ENCOUNTER — Other Ambulatory Visit: Payer: Self-pay | Admitting: Family Medicine

## 2013-03-25 ENCOUNTER — Other Ambulatory Visit: Payer: Self-pay | Admitting: Family Medicine

## 2013-04-01 ENCOUNTER — Telehealth: Payer: Self-pay | Admitting: Family Medicine

## 2013-04-01 MED ORDER — PROPRANOLOL HCL 80 MG PO TABS: ORAL_TABLET | ORAL | Status: DC

## 2013-04-01 NOTE — Telephone Encounter (Signed)
Spoke with pt and has enough lisinopril hctz but needs refill propranolol to [facility]. Advised done andpt will keep 01/09/*15 visit.

## 2013-04-01 NOTE — Telephone Encounter (Signed)
Pt scheduled med refill for 04/18/13 with Dr. Dayton Martes but he states that he will run out of his b/p meds before then.

## 2013-04-18 ENCOUNTER — Ambulatory Visit (INDEPENDENT_AMBULATORY_CARE_PROVIDER_SITE_OTHER): Payer: Managed Care, Other (non HMO) | Admitting: Family Medicine

## 2013-04-18 ENCOUNTER — Encounter: Payer: Self-pay | Admitting: Family Medicine

## 2013-04-18 VITALS — BP 126/82 | HR 53 | Temp 97.6°F | Ht 71.0 in | Wt 158.8 lb

## 2013-04-18 DIAGNOSIS — F17200 Nicotine dependence, unspecified, uncomplicated: Secondary | ICD-10-CM

## 2013-04-18 DIAGNOSIS — I10 Essential (primary) hypertension: Secondary | ICD-10-CM

## 2013-04-18 DIAGNOSIS — F411 Generalized anxiety disorder: Secondary | ICD-10-CM

## 2013-04-18 DIAGNOSIS — E785 Hyperlipidemia, unspecified: Secondary | ICD-10-CM

## 2013-04-18 LAB — LIPID PANEL
Cholesterol: 206 mg/dL — ABNORMAL HIGH (ref 0–200)
HDL: 48.5 mg/dL (ref >39.00)
Total CHOL/HDL Ratio: 4
Triglycerides: 88 mg/dL (ref 0.0–149.0)
VLDL: 17.6 mg/dL (ref 0.0–40.0)

## 2013-04-18 LAB — COMPREHENSIVE METABOLIC PANEL
ALBUMIN: 3.9 g/dL (ref 3.5–5.2)
ALT: 11 U/L (ref 0–53)
AST: 17 U/L (ref 0–37)
Alkaline Phosphatase: 59 U/L (ref 39–117)
BUN: 7 mg/dL (ref 6–23)
CALCIUM: 8.7 mg/dL (ref 8.4–10.5)
CO2: 26 meq/L (ref 19–32)
Chloride: 105 mEq/L (ref 96–112)
Creatinine, Ser: 0.8 mg/dL (ref 0.4–1.5)
GFR: 114.73 mL/min (ref >60.00)
Glucose, Bld: 78 mg/dL (ref 70–99)
POTASSIUM: 4.2 meq/L (ref 3.5–5.1)
SODIUM: 138 meq/L (ref 135–145)
TOTAL PROTEIN: 7 g/dL (ref 6.0–8.3)
Total Bilirubin: 0.5 mg/dL (ref 0.3–1.2)

## 2013-04-18 LAB — LDL CHOLESTEROL, DIRECT: Direct LDL: 139.5 mg/dL

## 2013-04-18 MED ORDER — VARDENAFIL HCL 20 MG PO TABS: ORAL_TABLET | ORAL | Status: DC

## 2013-04-18 MED ORDER — PROPRANOLOL HCL 80 MG PO TABS: ORAL_TABLET | ORAL | Status: DC

## 2013-04-18 MED ORDER — LISINOPRIL-HYDROCHLOROTHIAZIDE 20-12.5 MG PO TABS: 1.0000 | ORAL_TABLET | Freq: Every day | ORAL | Status: DC

## 2013-04-18 NOTE — Assessment & Plan Note (Signed)
Still smoking Not ready to quit 

## 2013-04-18 NOTE — Progress Notes (Signed)
43 yo male with h/o HTN, HLD and ED here for follow up.    HTN-  On Quinaretic 20-12.5 mg daily and propranolol 80 mg - 3 tabs tid.  Not having any side effects.  No CP, SOB, HA, lower extremity edema, fatigue, or dizziness.  No blurred vision. Lab Results  Component Value Date   CREATININE 0.71 07/12/2012    HLD-  .  Pt aware of his risk factors and knows that smoking negatively impacts his cholesterol. He has been trying e cigarettes and trying to cut back.  Also taking fish oil.  Continues to work on his diet and lipids have improved.  Wt Readings from Last 3 Encounters:  04/18/13 158 lb 12 oz (72.009 kg)  07/12/12 155 lb (70.308 kg)  10/20/11 153 lb (69.4 kg)     Lab Results  Component Value Date   CHOL 187 07/12/2012   HDL 50 07/12/2012   LDLCALC 114* 07/12/2012   LDLDIRECT 143.4 03/18/2010   TRIG 113 07/12/2012   CHOLHDL 3.7 07/12/2012      Patient Active Problem List   Diagnosis Date Noted  . HYPERLIPIDEMIA 12/03/2006  . ANXIETY 12/03/2006  . ERECTILE DYSFUNCTION 12/03/2006  . NICOTINE ADDICTION 12/03/2006  . HYPERTENSION 12/03/2006   Past Medical History  Diagnosis Date  . Hypertension   . Anxiety   . Hyperlipidemia    Past Surgical History  Procedure Laterality Date  . Ablation of dysrhythmic focus      negative  . Renal ultrasound  08/1998    normal   History  Substance Use Topics  . Smoking status: Current Every Day Smoker  . Smokeless tobacco: Not on file  . Alcohol Use: Not on file   Family History  Problem Relation Age of Onset  . Hypertension Mother   . Hyperlipidemia Mother   . Fibromyalgia Mother   . Colon polyps Father   . Cancer Paternal Aunt     breast   No Known Allergies Current Outpatient Prescriptions on File Prior to Visit  Medication Sig Dispense Refill  . aspirin 325 MG tablet Take 325 mg by mouth daily.        . Omega-3 Fatty Acids (FISH OIL) 1000 MG CAPS Take one by mouth daily      . vitamin C (ASCORBIC ACID) 500 MG tablet Take 500  mg by mouth daily.         No current facility-administered medications on file prior to visit.     Review of Systems       See HPI General:  Denies malaise. CV:  Denies chest pain or discomfort. Resp:  Denies shortness of breath.  Physical Exam BP 126/82  Pulse 53  Temp(Src) 97.6 F (36.4 C) (Oral)  Ht 5\' 11"  (1.803 m)  Wt 158 lb 12 oz (72.009 kg)  BMI 22.15 kg/m2  SpO2 97%  General:  alert, well-developed, well-nourished, and well-hydrated.   Head:  normocephalic and atraumatic.   Eyes:  vision grossly intact, pupils equal, pupils round, and pupils reactive to light.   Nose:  no external deformity.   Mouth:  good dentition.   Lungs:  normal respiratory effort, no intercostal retractions, no accessory muscle use, and normal breath sounds.   Heart:  normal rate, regular rhythm, and no murmur.   Extremities:  no edema either lower leg Psych:  normally interactive and good eye contact.

## 2013-04-18 NOTE — Patient Instructions (Signed)
Good to see you. I will call you with your lab results.   

## 2013-04-18 NOTE — Progress Notes (Signed)
Pre-visit discussion using our clinic review tool. No additional management support is needed unless otherwise documented below in the visit note.  

## 2013-04-18 NOTE — Assessment & Plan Note (Signed)
Well controlled.  No changes.  Rx refilled.

## 2013-04-18 NOTE — Assessment & Plan Note (Signed)
Recheck labs today. Orders Placed This Encounter  Procedures  . Comprehensive metabolic panel  . Lipid panel   No changes.

## 2013-04-21 ENCOUNTER — Encounter: Payer: Self-pay | Admitting: *Deleted

## 2013-07-05 ENCOUNTER — Other Ambulatory Visit: Payer: Self-pay | Admitting: Family Medicine

## 2014-01-22 ENCOUNTER — Ambulatory Visit (INDEPENDENT_AMBULATORY_CARE_PROVIDER_SITE_OTHER): Payer: Managed Care, Other (non HMO) | Admitting: Family Medicine

## 2014-01-22 ENCOUNTER — Encounter: Payer: Self-pay | Admitting: Family Medicine

## 2014-01-22 VITALS — BP 128/82 | HR 79 | Temp 97.7°F | Wt 171.2 lb

## 2014-01-22 DIAGNOSIS — I10 Essential (primary) hypertension: Secondary | ICD-10-CM

## 2014-01-22 DIAGNOSIS — E785 Hyperlipidemia, unspecified: Secondary | ICD-10-CM

## 2014-01-22 DIAGNOSIS — F411 Generalized anxiety disorder: Secondary | ICD-10-CM

## 2014-01-22 MED ORDER — LISINOPRIL-HYDROCHLOROTHIAZIDE 20-12.5 MG PO TABS: 1.0000 | ORAL_TABLET | Freq: Every day | ORAL | Status: DC

## 2014-01-22 MED ORDER — VARDENAFIL HCL 20 MG PO TABS: ORAL_TABLET | ORAL | Status: DC

## 2014-01-22 NOTE — Progress Notes (Signed)
43 yo male with h/o HTN, HLD and ED here for follow up.    He has no health insurance right now, would prefer to not do labs today.  HTN-  On Quinaretic 20-12.5 mg daily and propranolol 80 mg - 3 tabs tid.  Not having any side effects.  No CP, SOB, HA, lower extremity edema, fatigue, or dizziness.  No blurred vision. Lab Results  Component Value Date   CREATININE 0.8 04/18/2013    HLD-  .  Pt aware of his risk factors and knows that smoking negatively impacts his cholesterol. Wt Readings from Last 3 Encounters:  01/22/14 171 lb 4 oz (77.678 kg)  04/18/13 158 lb 12 oz (72.009 kg)  07/12/12 155 lb (70.308 kg)     Lab Results  Component Value Date   CHOL 206* 04/18/2013   HDL 48.50 04/18/2013   LDLCALC 114* 07/12/2012   LDLDIRECT 139.5 04/18/2013   TRIG 88.0 04/18/2013   CHOLHDL 4 04/18/2013      Patient Active Problem List   Diagnosis Date Noted  . HTN (hypertension) 12/03/2006  . Anxiety state 12/03/2006  . ERECTILE DYSFUNCTION 12/03/2006  . NICOTINE ADDICTION 12/03/2006  . HLD (hyperlipidemia) 12/03/2006   Past Medical History  Diagnosis Date  . Hypertension   . Anxiety   . Hyperlipidemia    Past Surgical History  Procedure Laterality Date  . Ablation of dysrhythmic focus      negative  . Renal ultrasound  08/1998    normal   History  Substance Use Topics  . Smoking status: Current Every Day Smoker  . Smokeless tobacco: Not on file  . Alcohol Use: Not on file   Family History  Problem Relation Age of Onset  . Hypertension Mother   . Hyperlipidemia Mother   . Fibromyalgia Mother   . Colon polyps Father   . Cancer Paternal Aunt     breast   No Known Allergies Current Outpatient Prescriptions on File Prior to Visit  Medication Sig Dispense Refill  . aspirin 325 MG tablet Take 325 mg by mouth daily.        . Omega-3 Fatty Acids (FISH OIL) 1000 MG CAPS Take one by mouth daily      . propranolol (INDERAL) 80 MG tablet TAKE TWO (2) TABLETS BY MOUTH 2 TIMES DAILY  360  tablet  2  . vitamin C (ASCORBIC ACID) 500 MG tablet Take 500 mg by mouth daily.         No current facility-administered medications on file prior to visit.     Review of Systems       See HPI General:  Denies malaise. CV:  Denies chest pain or discomfort.   Physical Exam BP 128/82  Pulse 79  Temp(Src) 97.7 F (36.5 C) (Oral)  Wt 171 lb 4 oz (77.678 kg)  SpO2 99%  General:  alert, well-developed, well-nourished, and well-hydrated.   Head:  normocephalic and atraumatic.   Eyes:  vision grossly intact, pupils equal, pupils round, and pupils reactive to light.   Nose:  no external deformity.   Mouth:  good dentition.   Lungs:  normal respiratory effort, no intercostal retractions, no accessory muscle use, and normal breath sounds.   Heart:  normal rate, regular rhythm, and no murmur.   Extremities:  no edema either lower leg Psych:  normally interactive and good eye contact.

## 2014-01-22 NOTE — Progress Notes (Signed)
Pre visit review using our clinic review tool, if applicable. No additional management support is needed unless otherwise documented below in the visit note. 

## 2014-01-22 NOTE — Assessment & Plan Note (Signed)
Well controlled. Rx ok to refill. Will follow up with me when he gets health insurance- he plans to get it by next month. The patient indicates understanding of these issues and agrees with the plan.

## 2014-01-23 ENCOUNTER — Telehealth: Payer: Self-pay | Admitting: Family Medicine

## 2014-01-23 NOTE — Telephone Encounter (Signed)
emmi mailed  °

## 2014-03-13 ENCOUNTER — Telehealth: Payer: Self-pay

## 2014-03-13 NOTE — Telephone Encounter (Signed)
Pt left v/m requesting rx for sildenafil instead of Levitra due to cost; Please advise. Pt request cb.[facility].

## 2014-03-16 MED ORDER — SILDENAFIL CITRATE 50 MG PO TABS: 50.0000 mg | ORAL_TABLET | Freq: Every day | ORAL | Status: DC | PRN

## 2014-03-16 NOTE — Telephone Encounter (Signed)
John Peters, can you call [facility] to see what their deal is for viagra.

## 2014-03-16 NOTE — Telephone Encounter (Signed)
50 for $80 cash only, but Rx must say sildenafil, not viagra

## 2014-03-16 NOTE — Telephone Encounter (Signed)
eRx sent

## 2014-03-20 ENCOUNTER — Telehealth: Payer: Self-pay

## 2014-03-20 NOTE — Telephone Encounter (Signed)
Pt left v/m requesting refill of med and requested cb. Left v/m for pt to return call; pt did not leave name of medication wanting refill of. [facility].

## 2014-03-20 NOTE — Telephone Encounter (Signed)
Pt wants sildenafil not viagra sent to [facility]; spoke with Pilar Plate at [place] and was advised Viagra cannot be on prescription for pt to get the sildenafil # 50 for $ 80.00 so the rx sent on 03/16/14 cannot be used. Pt said next week would be OK and request cb when sent to pharmacy.

## 2014-03-23 MED ORDER — SILDENAFIL CITRATE 50 MG PO TABS: 50.0000 mg | ORAL_TABLET | Freq: Every day | ORAL | Status: DC | PRN

## 2014-03-23 NOTE — Telephone Encounter (Signed)
That's what I thought I did, please call in rx. Thanks.

## 2014-03-26 ENCOUNTER — Telehealth: Payer: Self-pay

## 2014-03-26 NOTE — Telephone Encounter (Signed)
Pt left v/m requesting cb about med change; left v/m requesting pt cb to office. [facility].

## 2014-03-31 NOTE — Telephone Encounter (Signed)
Pt said [facility] still said viagra was sent in; spoke with Pilar Plate at [facility]; Pilar Plate said sildenafil 20 mg # 79 with instructions by Dr Deborra Medina with how pt should take med needs to be sent to pharmacy. Pilar Plate said if send in sildenafil 50 mg that the viagra 50 mg will come up. Viagra does not come in 20 mg. Pt request cb when done.

## 2014-04-01 MED ORDER — SILDENAFIL CITRATE 20 MG PO TABS: ORAL_TABLET | ORAL | Status: DC

## 2014-04-01 NOTE — Telephone Encounter (Signed)
eRx sent

## 2014-06-29 ENCOUNTER — Other Ambulatory Visit: Payer: Self-pay | Admitting: Family Medicine

## 2014-08-20 ENCOUNTER — Telehealth: Payer: Self-pay | Admitting: Family Medicine

## 2014-08-20 ENCOUNTER — Ambulatory Visit (INDEPENDENT_AMBULATORY_CARE_PROVIDER_SITE_OTHER): Payer: 59 | Admitting: Family Medicine

## 2014-08-20 ENCOUNTER — Ambulatory Visit: Payer: 59 | Admitting: Family Medicine

## 2014-08-20 NOTE — Telephone Encounter (Signed)
It is my understanding that he was rescheduled.

## 2014-08-20 NOTE — Telephone Encounter (Signed)
Patient did not come in for their appointment today for "wants referral".  Please let me know if patient needs to be contacted immediately for follow up or no follow up needed.

## 2014-08-26 ENCOUNTER — Other Ambulatory Visit: Payer: Self-pay | Admitting: Family Medicine

## 2014-08-27 ENCOUNTER — Encounter: Payer: Self-pay | Admitting: Family Medicine

## 2014-08-27 ENCOUNTER — Ambulatory Visit: Payer: 59 | Admitting: Family Medicine

## 2014-08-27 ENCOUNTER — Ambulatory Visit (INDEPENDENT_AMBULATORY_CARE_PROVIDER_SITE_OTHER): Payer: 59 | Admitting: Family Medicine

## 2014-08-27 VITALS — BP 120/78 | HR 61 | Temp 97.5°F | Ht 71.0 in | Wt 164.5 lb

## 2014-08-27 DIAGNOSIS — R0981 Nasal congestion: Secondary | ICD-10-CM | POA: Diagnosis not present

## 2014-08-27 NOTE — Patient Instructions (Signed)
Start claritin daily, try trial of flonase 2 sprays per day.  Wean off of nasal decongestant as able. Call if you cannot come off.  Stop at front desk to set up referral.
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
## 2014-08-27 NOTE — Telephone Encounter (Signed)
Pt requesting high quantity. pls advise

## 2014-08-27 NOTE — Assessment & Plan Note (Signed)
Trial of flonase and antihistamine. Nicotine abuse and chronic nasal decongestant use.. Contributing to symptoms.. Stop or wean off both. Refer to ENT.

## 2014-08-27 NOTE — Progress Notes (Signed)
Pre visit review using our clinic review tool, if applicable. No additional management support is needed unless otherwise documented below in the visit note. 

## 2015-07-02 ENCOUNTER — Other Ambulatory Visit: Payer: Self-pay | Admitting: Family Medicine

## 2015-07-29 ENCOUNTER — Ambulatory Visit (INDEPENDENT_AMBULATORY_CARE_PROVIDER_SITE_OTHER): Payer: BLUE CROSS/BLUE SHIELD | Admitting: Family Medicine

## 2015-07-29 ENCOUNTER — Encounter: Payer: Self-pay | Admitting: Family Medicine

## 2015-07-29 VITALS — BP 134/88 | HR 52 | Temp 97.6°F | Wt 160.5 lb

## 2015-07-29 DIAGNOSIS — E785 Hyperlipidemia, unspecified: Secondary | ICD-10-CM

## 2015-07-29 DIAGNOSIS — F528 Other sexual dysfunction not due to a substance or known physiological condition: Secondary | ICD-10-CM | POA: Diagnosis not present

## 2015-07-29 DIAGNOSIS — F17200 Nicotine dependence, unspecified, uncomplicated: Secondary | ICD-10-CM

## 2015-07-29 DIAGNOSIS — I10 Essential (primary) hypertension: Secondary | ICD-10-CM | POA: Diagnosis not present

## 2015-07-29 LAB — COMPREHENSIVE METABOLIC PANEL
ALT: 15 U/L (ref 0–53)
AST: 19 U/L (ref 0–37)
Albumin: 4.5 g/dL (ref 3.5–5.2)
Alkaline Phosphatase: 72 U/L (ref 39–117)
BILIRUBIN TOTAL: 0.8 mg/dL (ref 0.2–1.2)
BUN: 9 mg/dL (ref 6–23)
CO2: 28 meq/L (ref 19–32)
CREATININE: 0.83 mg/dL (ref 0.40–1.50)
Calcium: 9.8 mg/dL (ref 8.4–10.5)
Chloride: 100 mEq/L (ref 96–112)
GFR: 105.71 mL/min (ref >60.00)
GLUCOSE: 98 mg/dL (ref 70–99)
Potassium: 4.2 mEq/L (ref 3.5–5.1)
Sodium: 136 mEq/L (ref 135–145)
Total Protein: 7.3 g/dL (ref 6.0–8.3)

## 2015-07-29 LAB — LIPID PANEL
CHOL/HDL RATIO: 5
Cholesterol: 226 mg/dL — ABNORMAL HIGH (ref 0–200)
HDL: 49.1 mg/dL (ref >39.00)
LDL Cholesterol: 151 mg/dL — ABNORMAL HIGH (ref 0–99)
NONHDL: 177.21
Triglycerides: 129 mg/dL (ref 0.0–149.0)
VLDL: 25.8 mg/dL (ref 0.0–40.0)

## 2015-07-29 LAB — PSA: PSA: 0.22 ng/mL (ref 0.10–4.00)

## 2015-07-29 MED ORDER — LISINOPRIL-HYDROCHLOROTHIAZIDE 20-12.5 MG PO TABS: 1.0000 | ORAL_TABLET | Freq: Every day | ORAL | Status: DC

## 2015-07-29 MED ORDER — SILDENAFIL CITRATE 20 MG PO TABS: ORAL_TABLET | ORAL | Status: DC

## 2015-07-29 NOTE — Progress Notes (Signed)
Pre visit review using our clinic review tool, if applicable. No additional management support is needed unless otherwise documented below in the visit note. 

## 2015-07-29 NOTE — Assessment & Plan Note (Signed)
Due for labs today. No changes made. 

## 2015-07-29 NOTE — Assessment & Plan Note (Signed)
Well controlled.  No changes made. 

## 2015-07-29 NOTE — Assessment & Plan Note (Signed)
Working on cutting back. Smoking cessation instruction/counseling given:  counseled patient on the dangers of tobacco use, advised patient to stop smoking, and reviewed strategies to maximize success

## 2015-07-29 NOTE — Assessment & Plan Note (Signed)
Viagra rx refilled.

## 2015-07-29 NOTE — Progress Notes (Signed)
45 yo male with h/o HTN, HLD and ED here for follow up.    Refused labs at last OV but now has health insurance but he is willing to get labs done today.  Has a new job- loves it.  Traveling and active.  Still smoking 10 cigs per day, cutting back.  HTN-  On Quinaretic 20-12.5 mg daily and propranolol 80 mg - 3 tabs tid.  Not having any side effects.  No CP, SOB, HA, lower extremity edema, fatigue, or dizziness.  No blurred vision. Lab Results  Component Value Date   CREATININE 0.8 04/18/2013    HLD-  .  Pt aware of his risk factors and knows that smoking negatively impacts his cholesterol. Wt Readings from Last 3 Encounters:  07/29/15 160 lb 8 oz (72.802 kg)  08/27/14 164 lb 8 oz (74.617 kg)  01/22/14 171 lb 4 oz (77.678 kg)     Lab Results  Component Value Date   CHOL 206* 04/18/2013   HDL 48.50 04/18/2013   LDLCALC 114* 07/12/2012   LDLDIRECT 139.5 04/18/2013   TRIG 88.0 04/18/2013   CHOLHDL 4 04/18/2013      Patient Active Problem List   Diagnosis Date Noted  . Chronic nasal congestion 08/27/2014  . HTN (hypertension) 12/03/2006  . Anxiety state 12/03/2006  . ERECTILE DYSFUNCTION 12/03/2006  . NICOTINE ADDICTION 12/03/2006  . HLD (hyperlipidemia) 12/03/2006   Past Medical History  Diagnosis Date  . Hypertension   . Anxiety   . Hyperlipidemia    Past Surgical History  Procedure Laterality Date  . Ablation of dysrhythmic focus      negative  . Renal ultrasound  08/1998    normal   Social History  Substance Use Topics  . Smoking status: Current Every Day Smoker  . Smokeless tobacco: Never Used  . Alcohol Use: 0.0 oz/week    0 Standard drinks or equivalent per week   Family History  Problem Relation Age of Onset  . Hypertension Mother   . Hyperlipidemia Mother   . Fibromyalgia Mother   . Colon polyps Father   . Cancer Paternal Aunt     breast   No Known Allergies Current Outpatient Prescriptions on File Prior to Visit  Medication Sig Dispense  Refill  . aspirin 325 MG tablet Take 325 mg by mouth daily.      . Coenzyme Q10 50 MG CAPS Take 1 capsule by mouth daily.    . Omega-3 Fatty Acids (FISH OIL) 1000 MG CAPS Take one by mouth daily    . propranolol (INDERAL) 80 MG tablet TAKE TWO (2) TABLETS BY MOUTH 2 TIMES DAILY 360 tablet 3  . vitamin C (ASCORBIC ACID) 500 MG tablet Take 500 mg by mouth daily.       No current facility-administered medications on file prior to visit.     Review of Systems  Constitutional: Negative.   HENT: Negative.   Respiratory: Negative.   Cardiovascular: Negative.   Gastrointestinal: Negative.   Endocrine: Negative.   Genitourinary: Negative.   Musculoskeletal: Negative.   Skin: Negative.   Allergic/Immunologic: Negative.   Neurological: Negative.   Hematological: Negative.   Psychiatric/Behavioral: Negative.   All other systems reviewed and are negative.     Physical Exam BP 134/88 mmHg  Pulse 52  Temp(Src) 97.6 F (36.4 C) (Oral)  Wt 160 lb 8 oz (72.802 kg)  SpO2 99%  Physical Exam  Constitutional: He is oriented to person, place, and time. He appears well-developed and well-nourished.  No distress.  HENT:  Head: Normocephalic.  Eyes: Pupils are equal, round, and reactive to light.  Cardiovascular: Normal rate.   Pulmonary/Chest: Effort normal and breath sounds normal.  Musculoskeletal: Normal range of motion.  Neurological: He is alert and oriented to person, place, and time.  Skin: Skin is warm and dry. He is not diaphoretic.  Psychiatric: He has a normal mood and affect. His behavior is normal.  Nursing note and vitals reviewed.

## 2015-07-30 ENCOUNTER — Encounter: Payer: Self-pay | Admitting: *Deleted

## 2015-08-27 ENCOUNTER — Other Ambulatory Visit: Payer: Self-pay | Admitting: Family Medicine

## 2016-01-06 DIAGNOSIS — Z23 Encounter for immunization: Secondary | ICD-10-CM | POA: Diagnosis not present

## 2016-09-01 ENCOUNTER — Other Ambulatory Visit: Payer: Self-pay | Admitting: Family Medicine

## 2016-09-22 ENCOUNTER — Emergency Department: Payer: BLUE CROSS/BLUE SHIELD

## 2016-09-22 ENCOUNTER — Emergency Department
Admission: EM | Admit: 2016-09-22 | Discharge: 2016-09-22 | Disposition: A | Discharge status: Home / Self Care | Payer: BLUE CROSS/BLUE SHIELD | Attending: Emergency Medicine | Admitting: Emergency Medicine

## 2016-09-22 DIAGNOSIS — F10920 Alcohol use, unspecified with intoxication, uncomplicated: Secondary | ICD-10-CM | POA: Insufficient documentation

## 2016-09-22 DIAGNOSIS — Z7982 Long term (current) use of aspirin: Secondary | ICD-10-CM | POA: Diagnosis not present

## 2016-09-22 DIAGNOSIS — M542 Cervicalgia: Secondary | ICD-10-CM | POA: Diagnosis not present

## 2016-09-22 DIAGNOSIS — Y939 Activity, unspecified: Secondary | ICD-10-CM | POA: Insufficient documentation

## 2016-09-22 DIAGNOSIS — F17200 Nicotine dependence, unspecified, uncomplicated: Secondary | ICD-10-CM | POA: Insufficient documentation

## 2016-09-22 DIAGNOSIS — S0991XA Unspecified injury of ear, initial encounter: Secondary | ICD-10-CM | POA: Diagnosis present

## 2016-09-22 DIAGNOSIS — S01312A Laceration without foreign body of left ear, initial encounter: Secondary | ICD-10-CM | POA: Diagnosis not present

## 2016-09-22 DIAGNOSIS — Z79899 Other long term (current) drug therapy: Secondary | ICD-10-CM | POA: Insufficient documentation

## 2016-09-22 DIAGNOSIS — R51 Headache: Secondary | ICD-10-CM | POA: Diagnosis not present

## 2016-09-22 DIAGNOSIS — S199XXA Unspecified injury of neck, initial encounter: Secondary | ICD-10-CM | POA: Diagnosis not present

## 2016-09-22 DIAGNOSIS — S0990XA Unspecified injury of head, initial encounter: Secondary | ICD-10-CM | POA: Diagnosis not present

## 2016-09-22 DIAGNOSIS — R4781 Slurred speech: Secondary | ICD-10-CM | POA: Insufficient documentation

## 2016-09-22 DIAGNOSIS — W2209XA Striking against other stationary object, initial encounter: Secondary | ICD-10-CM | POA: Diagnosis not present

## 2016-09-22 DIAGNOSIS — Y929 Unspecified place or not applicable: Secondary | ICD-10-CM | POA: Diagnosis not present

## 2016-09-22 DIAGNOSIS — F10129 Alcohol abuse with intoxication, unspecified: Secondary | ICD-10-CM | POA: Diagnosis not present

## 2016-09-22 DIAGNOSIS — Y999 Unspecified external cause status: Secondary | ICD-10-CM | POA: Diagnosis not present

## 2016-09-22 DIAGNOSIS — I10 Essential (primary) hypertension: Secondary | ICD-10-CM | POA: Diagnosis not present

## 2016-09-22 DIAGNOSIS — W19XXXA Unspecified fall, initial encounter: Secondary | ICD-10-CM

## 2016-09-22 LAB — CBC
HEMATOCRIT: 44.4 % (ref 40.0–52.0)
HEMOGLOBIN: 15.5 g/dL (ref 13.0–18.0)
MCH: 35.1 pg — ABNORMAL HIGH (ref 26.0–34.0)
MCHC: 34.9 g/dL (ref 32.0–36.0)
MCV: 100.7 fL — AB (ref 80.0–100.0)
Platelets: 229 10*3/uL (ref 150–440)
RBC: 4.41 MIL/uL (ref 4.40–5.90)
RDW: 13.6 % (ref 11.5–14.5)
WBC: 9.9 10*3/uL (ref 3.8–10.6)

## 2016-09-22 LAB — BASIC METABOLIC PANEL
ANION GAP: 10 (ref 5–15)
BUN: 7 mg/dL (ref 6–20)
CHLORIDE: 104 mmol/L (ref 101–111)
CO2: 25 mmol/L (ref 22–32)
Calcium: 8.8 mg/dL — ABNORMAL LOW (ref 8.9–10.3)
Creatinine, Ser: 0.68 mg/dL (ref 0.61–1.24)
GFR calc Af Amer: >60 mL/min (ref >60)
GFR calc non Af Amer: >60 mL/min (ref >60)
Glucose, Bld: 91 mg/dL (ref 65–99)
POTASSIUM: 3.7 mmol/L (ref 3.5–5.1)
SODIUM: 139 mmol/L (ref 135–145)

## 2016-09-22 LAB — ETHANOL: Alcohol, Ethyl (B): 309 mg/dL (ref <5)

## 2016-09-22 LAB — TROPONIN I: Troponin I: <0.03 ng/mL (ref <0.03)

## 2016-09-22 MED ORDER — LIDOCAINE HCL (PF) 1 % IJ SOLN
INTRAMUSCULAR | Status: AC
Start: 2016-09-22 — End: 2016-09-22
  Administered 2016-09-22: 5 mL
  Filled 2016-09-22: qty 5

## 2016-09-22 NOTE — Discharge Instructions (Signed)
PLease have sutures removed in 10 days

## 2016-09-22 NOTE — ED Notes (Addendum)
Heard yelling coming from room. Patient and his mother was arguing about patient having the CT scan of his head. Patient is refusing to have this done. "i don't need it, I don't need the big bill." This RN explained to patient the reason behind the Ct of the head and the fact the patient did hit his head and he does have alcohol on board and the increase risk for bleeding. After explaining this patient is still refusing to have CT of the head. Patient stated, "just fix my ear." and "i do this all the time."  EDP aware of patient refusal.

## 2016-09-22 NOTE — ED Provider Notes (Signed)
[facility] Emergency Department Provider Note   ____________________________________________   First MD Initiated Contact with Patient 09/22/16 0257     (approximate)  I have reviewed the triage vital signs and the nursing notes.   HISTORY  Chief Complaint Fall and Alcohol Intoxication    HPI John Peters is a 46 y.o. male who comes into the hospital today with a fall. The patient reports that he hit his left ear and the corner of a dresser and cut it open. The patient denies loss of consciousness and but is intoxicated. The patient rates his pain at 5-6 out of 10 in intensity. He reports that he drinks daily and he lost his balance. He reports that he is not really that drunk any only had a few beers. He reports that there was a stool in the way and he tripped over his stool. The patient is here for evaluation and treatment of his ear injury.   Past Medical History:  Diagnosis Date  . Anxiety   . Hyperlipidemia   . Hypertension     Patient Active Problem List   Diagnosis Date Noted  . Chronic nasal congestion 08/27/2014  . HTN (hypertension) 12/03/2006  . Anxiety state 12/03/2006  . ERECTILE DYSFUNCTION 12/03/2006  . NICOTINE ADDICTION 12/03/2006  . HLD (hyperlipidemia) 12/03/2006    Past Surgical History:  Procedure Laterality Date  . ABLATION OF DYSRHYTHMIC FOCUS     negative  . renal ultrasound  08/1998   normal    Prior to Admission medications   Medication Sig Start Date End Date Taking? Authorizing Provider  aspirin 325 MG tablet Take 325 mg by mouth daily.      [provider]  Coenzyme Q10 50 MG CAPS Take 1 capsule by mouth daily.    [provider]  lisinopril-hydrochlorothiazide (PRINZIDE,ZESTORETIC) 20-12.5 MG tablet Take 1 tablet by mouth daily. 07/29/15   Lucille Passy, MD  Omega-3 Fatty Acids (FISH OIL) 1000 MG CAPS Take one by mouth daily    [provider]  propranolol (INDERAL) 40 MG tablet  TAKE FOUR (4) TABLETS BY MOUTH TWO TIMESA DAY 09/01/16   Lucille Passy, MD  propranolol (INDERAL) 80 MG tablet TAKE TWO (2) TABLETS BY MOUTH 2 TIMES DAILY 08/30/15   Lucille Passy, MD  sildenafil (REVATIO) 20 MG tablet TAKE 1 TO 2 TABLETS (20-40MG ) BY MOUTH 60 MINUTES PRIOR TO INTERCOURSE AS DIRECTED *NOT TO EXCEED 5 TABS IN 24 HOURS* 09/01/16   Lucille Passy, MD  vitamin C (ASCORBIC ACID) 500 MG tablet Take 500 mg by mouth daily.      [provider]    Allergies Patient has no known allergies.  Family History  Problem Relation Age of Onset  . Colon polyps Father   . Hypertension Mother   . Hyperlipidemia Mother   . Fibromyalgia Mother   . Cancer Paternal Aunt        breast    Social History Social History  Substance Use Topics  . Smoking status: Current Every Day Smoker  . Smokeless tobacco: Never Used  . Alcohol use Yes     Comment: Pt is an every day drinker    Review of Systems  Constitutional: No fever/chills Eyes: No visual changes. ENT: No sore throat. Cardiovascular: Denies chest pain. Respiratory: Denies shortness of breath. Gastrointestinal: No abdominal pain.  No nausea, no vomiting.  No diarrhea.  No constipation. Genitourinary: Negative for dysuria. Musculoskeletal: Negative for back pain. Skin:  Ear laceration  Neurological: Negative for headaches, focal weakness or numbness.   ____________________________________________   PHYSICAL EXAM:  VITAL SIGNS: ED Triage Vitals  Enc Vitals Group     BP 09/22/16 0143 (!) 185/116     Pulse Rate 09/22/16 0143 69     Resp 09/22/16 0143 20     Temp --      Temp src --      SpO2 09/22/16 0143 98 %     Weight 09/22/16 0143 165 lb (74.8 kg)     Height 09/22/16 0143 5\' 11"  (1.803 m)     Head Circumference --      Peak Flow --      Pain Score 09/22/16 0140 3     Pain Loc --      Pain Edu? --      Excl. in [place]? --     Constitutional: Alert and oriented. Well appearing and in mild distress. Eyes:  Conjunctivae are normal. PERRL. EOMI. Head: Laceration to the left pinna with a flap of skin separated from the remaining year. Patient also has another 4 cm laceration behind his left ear. Nose: No congestion/rhinnorhea. Mouth/Throat: Mucous membranes are moist.  Oropharynx non-erythematous. Cardiovascular: Normal rate, regular rhythm. Grossly normal heart sounds.  Good peripheral circulation. Respiratory: Normal respiratory effort.  No retractions. Lungs CTAB. Gastrointestinal: Soft and nontender. No distention. Positive bowel sounds Musculoskeletal: No lower extremity tenderness nor edema.  Neurologic:  Normal speech and language. Positive bowel sounds Skin:  Skin is warm, dry and intact.  Psychiatric: Mood and affect are normal.   ____________________________________________   LABS (all labs ordered are listed, but only abnormal results are displayed)  Labs Reviewed  ETHANOL - Abnormal; Notable for the following:       Result Value   Alcohol, Ethyl (B) 309 (*)    All other components within normal limits  BASIC METABOLIC PANEL - Abnormal; Notable for the following:    Calcium 8.8 (*)    All other components within normal limits  CBC - Abnormal; Notable for the following:    MCV 100.7 (*)    MCH 35.1 (*)    All other components within normal limits  TROPONIN I   ____________________________________________  EKG  none ____________________________________________  RADIOLOGY  Ct Head Wo Contrast  Result Date: 09/22/2016 CLINICAL DATA:  Status post fall against corner of countertop. Injury to ear. Hit side of head. Slurred speech. Concern for cervical spine injury. Initial encounter. EXAM: CT HEAD WITHOUT CONTRAST CT CERVICAL SPINE WITHOUT CONTRAST TECHNIQUE: Multidetector CT imaging of the head and cervical spine was performed following the standard protocol without intravenous contrast. Multiplanar CT image reconstructions of the cervical spine were also generated.  COMPARISON:  None. FINDINGS: CT HEAD FINDINGS Brain: No evidence of acute infarction, hemorrhage, hydrocephalus, extra-axial collection or mass lesion/mass effect. Prominence of the sulci suggests mild cortical volume loss. Mild cerebellar atrophy is noted. The brainstem and fourth ventricle are within normal limits. The basal ganglia are unremarkable in appearance. The cerebral hemispheres demonstrate grossly normal gray-white differentiation. No mass effect or midline shift is seen. Vascular: No hyperdense vessel or unexpected calcification. Skull: There is no evidence of fracture; visualized osseous structures are unremarkable in appearance. Sinuses/Orbits: The visualized portions of the orbits are within normal limits. The paranasal sinuses and mastoid air cells are well-aerated. Other: No significant soft tissue abnormalities are seen. CT CERVICAL SPINE FINDINGS Alignment: The visualized portions of the thyroid gland are unremarkable. Skull base and vertebrae:  No acute fracture. No primary bone lesion or focal pathologic process. Soft tissues and spinal canal: No prevertebral fluid or swelling. No visible canal hematoma. Disc levels: There is slight narrowing of the intervertebral disc space at C4-C5, with anterior and posterior disc osteophyte complexes. Remaining intervertebral disc spaces are preserved. Upper chest: The visualized portions of the thyroid gland are unremarkable. Other: The known left ear laceration is difficult to fully characterize on CT. IMPRESSION: 1. No evidence of traumatic intracranial injury or fracture. 2. No evidence of fracture or subluxation along the cervical spine. 3. Known left ear laceration is difficult to fully characterize on CT. 4. Mild cortical volume loss noted. 5. Minimal degenerative change at the mid cervical spine. Electronically Signed   By: Garald Balding M.D.   On: 09/22/2016 04:00   Ct Cervical Spine Wo Contrast  Result Date: 09/22/2016 CLINICAL DATA:  Status  post fall against corner of countertop. Injury to ear. Hit side of head. Slurred speech. Concern for cervical spine injury. Initial encounter. EXAM: CT HEAD WITHOUT CONTRAST CT CERVICAL SPINE WITHOUT CONTRAST TECHNIQUE: Multidetector CT imaging of the head and cervical spine was performed following the standard protocol without intravenous contrast. Multiplanar CT image reconstructions of the cervical spine were also generated. COMPARISON:  None. FINDINGS: CT HEAD FINDINGS Brain: No evidence of acute infarction, hemorrhage, hydrocephalus, extra-axial collection or mass lesion/mass effect. Prominence of the sulci suggests mild cortical volume loss. Mild cerebellar atrophy is noted. The brainstem and fourth ventricle are within normal limits. The basal ganglia are unremarkable in appearance. The cerebral hemispheres demonstrate grossly normal gray-white differentiation. No mass effect or midline shift is seen. Vascular: No hyperdense vessel or unexpected calcification. Skull: There is no evidence of fracture; visualized osseous structures are unremarkable in appearance. Sinuses/Orbits: The visualized portions of the orbits are within normal limits. The paranasal sinuses and mastoid air cells are well-aerated. Other: No significant soft tissue abnormalities are seen. CT CERVICAL SPINE FINDINGS Alignment: The visualized portions of the thyroid gland are unremarkable. Skull base and vertebrae: No acute fracture. No primary bone lesion or focal pathologic process. Soft tissues and spinal canal: No prevertebral fluid or swelling. No visible canal hematoma. Disc levels: There is slight narrowing of the intervertebral disc space at C4-C5, with anterior and posterior disc osteophyte complexes. Remaining intervertebral disc spaces are preserved. Upper chest: The visualized portions of the thyroid gland are unremarkable. Other: The known left ear laceration is difficult to fully characterize on CT. IMPRESSION: 1. No evidence of  traumatic intracranial injury or fracture. 2. No evidence of fracture or subluxation along the cervical spine. 3. Known left ear laceration is difficult to fully characterize on CT. 4. Mild cortical volume loss noted. 5. Minimal degenerative change at the mid cervical spine. Electronically Signed   By: Garald Balding M.D.   On: 09/22/2016 04:00    ____________________________________________   PROCEDURES  Procedure(s) performed: please, see procedure note(s).  Marland Kitchen.Laceration Repair Date/Time: 09/22/2016 4:24 AM Performed by: Loney Hering Authorized by: Loney Hering   Consent:    Consent obtained:  Verbal   Consent given by:  Patient   Risks discussed:  Infection, poor cosmetic result and poor wound healing Anesthesia (see MAR for exact dosages):    Anesthesia method:  Local infiltration   Local anesthetic:  Lidocaine 1% w/o epi Laceration details:    Location:  Ear   Ear location:  L ear Repair type:    Repair type:  Simple Pre-procedure details:  Preparation:  Patient was prepped and draped in usual sterile fashion Exploration:    Contaminated: no   Treatment:    Area cleansed with:  Betadine, saline and Hibiclens   Amount of cleaning:  Standard Skin repair:    Repair method:  Sutures   Suture size:  5-0   Suture material:  Nylon   Suture technique:  Running   Number of sutures:  14 Approximation:    Approximation:  Loose Post-procedure details:    Dressing:  Antibiotic ointment (pressure dressing)   Patient tolerance of procedure:  Tolerated well, no immediate complications .Marland KitchenLaceration Repair Date/Time: 09/22/2016 4:45 AM Performed by: Loney Hering Authorized by: Loney Hering   Consent:    Consent obtained:  Verbal   Consent given by:  Patient   Risks discussed:  Infection, poor cosmetic result, poor wound healing and need for additional repair Anesthesia (see MAR for exact dosages):    Anesthesia method:  Local infiltration   Local  anesthetic:  Lidocaine 1% w/o epi Laceration details:    Location:  Ear   Ear location:  L ear   Length (cm):  4 Repair type:    Repair type:  Simple Pre-procedure details:    Preparation:  Patient was prepped and draped in usual sterile fashion Exploration:    Contaminated: no   Treatment:    Area cleansed with:  Betadine, saline and Hibiclens   Amount of cleaning:  Standard Skin repair:    Repair method:  Sutures   Suture size:  5-0   Suture material:  Nylon   Suture technique:  Simple interrupted   Number of sutures:  4 Approximation:    Approximation:  Loose Post-procedure details:    Dressing:  Antibiotic ointment   Patient tolerance of procedure:  Tolerated well, no immediate complications    Critical Care performed: No  ____________________________________________   INITIAL IMPRESSION / ASSESSMENT AND PLAN / ED COURSE  Pertinent labs & imaging results that were available during my care of the patient were reviewed by me and considered in my medical decision making (see chart for details).  This is a 46 year old male who comes into the hospital today after falling. The patient hit his ear on a dresser. I did repair the patient's laceration and I did send him for a CT scan of his head and his cervical spine which was unremarkable. I did repair the patient's ear. We will place a pressure dressing on his ear and he'll be discharged to home.  Clinical Course as of Sep 23 507  Fri Sep 22, 2016  0406 1. No evidence of traumatic intracranial injury or fracture. 2. No evidence of fracture or subluxation along the cervical spine. 3. Known left ear laceration is difficult to fully characterize on CT. 4. Mild cortical volume loss noted. 5. Minimal degenerative change at the mid cervical spine.   CT Head Wo Contrast [AW]    Clinical Course User Index [AW] Loney Hering, MD     ____________________________________________   FINAL CLINICAL IMPRESSION(S) / ED  DIAGNOSES  Final diagnoses:  Alcoholic intoxication without complication ([place])  Fall, initial encounter  Injury of ear, initial encounter      NEW MEDICATIONS STARTED DURING THIS VISIT:  New Prescriptions   No medications on file     Note:  This document was prepared using Dragon voice recognition software and may include unintentional dictation errors.    Loney Hering, MD 09/22/16 [phone]

## 2016-09-22 NOTE — ED Notes (Signed)
Patient ambulatory to hallway bathroom.

## 2016-09-22 NOTE — ED Triage Notes (Signed)
Pt fell tonight against the corner of a countertop.  Pt appears very intoxicated and per mother and girlfriend in triage room, pt is an everyday drinker.  Pt has split his ear and hit the side of his head.  Pt is slurring his words and repeating himself.  Pt states that he drinks a 6 pack of beer on a daily basis.  Pt ambulatory to triage.

## 2016-09-28 ENCOUNTER — Ambulatory Visit: Payer: BLUE CROSS/BLUE SHIELD | Admitting: Family Medicine

## 2016-09-28 ENCOUNTER — Encounter: Payer: Self-pay | Admitting: Family Medicine

## 2016-09-28 VITALS — BP 162/120 | HR 59 | Ht 71.0 in | Wt 144.0 lb

## 2016-09-28 DIAGNOSIS — Z09 Encounter for follow-up examination after completed treatment for conditions other than malignant neoplasm: Secondary | ICD-10-CM | POA: Insufficient documentation

## 2016-09-28 NOTE — Progress Notes (Unsigned)
Pre visit review using our clinic review tool, if applicable. No additional management support is needed unless otherwise documented below in the visit note. 

## 2016-09-28 NOTE — Progress Notes (Deleted)
Subjective:   Patient ID: John Peters, male    DOB: 1970-10-10, 46 y.o.   MRN: [id number]  John Peters is a pleasant 46 y.o. year old male who presents to clinic today with Medication Refill  on 09/28/2016  HPI:  Was seen in the ER last Friday, 09/22/16 after he fell and hit is head on the dresser.  ETOH level was 309 in the ER.  CT of head and neck were unremarkable.  Sutures and pressure dressing to his ear and discharged home.  Ct Head Wo Contrast  Result Date: 09/22/2016 CLINICAL DATA:  Status post fall against corner of countertop. Injury to ear. Hit side of head. Slurred speech. Concern for cervical spine injury. Initial encounter. EXAM: CT HEAD WITHOUT CONTRAST CT CERVICAL SPINE WITHOUT CONTRAST TECHNIQUE: Multidetector CT imaging of the head and cervical spine was performed following the standard protocol without intravenous contrast. Multiplanar CT image reconstructions of the cervical spine were also generated. COMPARISON:  None. FINDINGS: CT HEAD FINDINGS Brain: No evidence of acute infarction, hemorrhage, hydrocephalus, extra-axial collection or mass lesion/mass effect. Prominence of the sulci suggests mild cortical volume loss. Mild cerebellar atrophy is noted. The brainstem and fourth ventricle are within normal limits. The basal ganglia are unremarkable in appearance. The cerebral hemispheres demonstrate grossly normal gray-white differentiation. No mass effect or midline shift is seen. Vascular: No hyperdense vessel or unexpected calcification. Skull: There is no evidence of fracture; visualized osseous structures are unremarkable in appearance. Sinuses/Orbits: The visualized portions of the orbits are within normal limits. The paranasal sinuses and mastoid air cells are well-aerated. Other: No significant soft tissue abnormalities are seen. CT CERVICAL SPINE FINDINGS Alignment: The visualized portions of the thyroid gland are unremarkable. Skull base and vertebrae: No acute  fracture. No primary bone lesion or focal pathologic process. Soft tissues and spinal canal: No prevertebral fluid or swelling. No visible canal hematoma. Disc levels: There is slight narrowing of the intervertebral disc space at C4-C5, with anterior and posterior disc osteophyte complexes. Remaining intervertebral disc spaces are preserved. Upper chest: The visualized portions of the thyroid gland are unremarkable. Other: The known left ear laceration is difficult to fully characterize on CT. IMPRESSION: 1. No evidence of traumatic intracranial injury or fracture. 2. No evidence of fracture or subluxation along the cervical spine. 3. Known left ear laceration is difficult to fully characterize on CT. 4. Mild cortical volume loss noted. 5. Minimal degenerative change at the mid cervical spine. Electronically Signed   By: Garald Balding M.D.   On: 09/22/2016 04:00   Ct Cervical Spine Wo Contrast  Result Date: 09/22/2016 CLINICAL DATA:  Status post fall against corner of countertop. Injury to ear. Hit side of head. Slurred speech. Concern for cervical spine injury. Initial encounter. EXAM: CT HEAD WITHOUT CONTRAST CT CERVICAL SPINE WITHOUT CONTRAST TECHNIQUE: Multidetector CT imaging of the head and cervical spine was performed following the standard protocol without intravenous contrast. Multiplanar CT image reconstructions of the cervical spine were also generated. COMPARISON:  None. FINDINGS: CT HEAD FINDINGS Brain: No evidence of acute infarction, hemorrhage, hydrocephalus, extra-axial collection or mass lesion/mass effect. Prominence of the sulci suggests mild cortical volume loss. Mild cerebellar atrophy is noted. The brainstem and fourth ventricle are within normal limits. The basal ganglia are unremarkable in appearance. The cerebral hemispheres demonstrate grossly normal gray-white differentiation. No mass effect or midline shift is seen. Vascular: No hyperdense vessel or unexpected calcification. Skull:  There is no evidence of fracture; visualized osseous  structures are unremarkable in appearance. Sinuses/Orbits: The visualized portions of the orbits are within normal limits. The paranasal sinuses and mastoid air cells are well-aerated. Other: No significant soft tissue abnormalities are seen. CT CERVICAL SPINE FINDINGS Alignment: The visualized portions of the thyroid gland are unremarkable. Skull base and vertebrae: No acute fracture. No primary bone lesion or focal pathologic process. Soft tissues and spinal canal: No prevertebral fluid or swelling. No visible canal hematoma. Disc levels: There is slight narrowing of the intervertebral disc space at C4-C5, with anterior and posterior disc osteophyte complexes. Remaining intervertebral disc spaces are preserved. Upper chest: The visualized portions of the thyroid gland are unremarkable. Other: The known left ear laceration is difficult to fully characterize on CT. IMPRESSION: 1. No evidence of traumatic intracranial injury or fracture. 2. No evidence of fracture or subluxation along the cervical spine. 3. Known left ear laceration is difficult to fully characterize on CT. 4. Mild cortical volume loss noted. 5. Minimal degenerative change at the mid cervical spine. Electronically Signed   By: Garald Balding M.D.   On: 09/22/2016 04:00     HTN-  On Quinaretic 20-12.5 mg daily and propranolol 80 mg - 3 tabs tid.  Not having any side effects.  No CP, SOB, HA, lower extremity edema, fatigue, or dizziness.  No blurred vision.  Lab Results  Component Value Date   CREATININE 0.68 09/22/2016   HLD-  .  Pt aware of his risk factors and knows that smoking negatively impacts his cholesterol.  Current Outpatient Prescriptions on File Prior to Visit  Medication Sig Dispense Refill  . aspirin 325 MG tablet Take 325 mg by mouth daily.      . Coenzyme Q10 50 MG CAPS Take 1 capsule by mouth daily.    Marland Kitchen lisinopril-hydrochlorothiazide (PRINZIDE,ZESTORETIC) 20-12.5 MG  tablet Take 1 tablet by mouth daily. 90 tablet 3  . Omega-3 Fatty Acids (FISH OIL) 1000 MG CAPS Take one by mouth daily    . propranolol (INDERAL) 40 MG tablet TAKE FOUR (4) TABLETS BY MOUTH TWO TIMESA DAY 240 tablet 0  . propranolol (INDERAL) 80 MG tablet TAKE TWO (2) TABLETS BY MOUTH 2 TIMES DAILY 360 tablet 1  . sildenafil (REVATIO) 20 MG tablet TAKE 1 TO 2 TABLETS (20-40MG ) BY MOUTH 60 MINUTES PRIOR TO INTERCOURSE AS DIRECTED *NOT TO EXCEED 5 TABS IN 24 HOURS* 50 tablet 0  . vitamin C (ASCORBIC ACID) 500 MG tablet Take 500 mg by mouth daily.       No current facility-administered medications on file prior to visit.     No Known Allergies  Past Medical History:  Diagnosis Date  . Anxiety   . Hyperlipidemia   . Hypertension     Past Surgical History:  Procedure Laterality Date  . ABLATION OF DYSRHYTHMIC FOCUS     negative  . renal ultrasound  08/1998   normal    Family History  Problem Relation Age of Onset  . Colon polyps Father   . Hypertension Mother   . Hyperlipidemia Mother   . Fibromyalgia Mother   . Cancer Paternal Aunt        breast    Social History   Social History  . Marital status: Single    Spouse name: N/A  . Number of children: 2  . Years of education: N/A   Occupational History  . Machine operator [employer]   Social History Main Topics  . Smoking status: Current Every Day Smoker  . Smokeless  tobacco: Never Used  . Alcohol use Yes     Comment: Pt is an every day drinker  . Drug use: No  . Sexual activity: Not on file   Other Topics Concern  . Not on file   Social History Narrative  . No narrative on file   The PMH, PSH, Social History, Family History, Medications, and allergies have been reviewed in [facility], and have been updated if relevant.  Review of Systems  Constitutional: Negative.   HENT: Negative.   Eyes: Negative.   Respiratory: Negative.   Cardiovascular: Negative.   Gastrointestinal: Negative.   Endocrine: Negative.     Genitourinary: Negative.   Musculoskeletal: Negative.   Allergic/Immunologic: Negative.   Neurological: Negative.   Hematological: Negative.   Psychiatric/Behavioral: Negative.   All other systems reviewed and are negative.      Objective:    BP (!) 162/120   Pulse (!) 59   Ht 5\' 11"  (1.803 m)   Wt 144 lb (65.3 kg)   SpO2 98%   BMI 20.08 kg/m    Physical Exam        Assessment & Plan:   Mixed hyperlipidemia  Essential hypertension  Hospital discharge follow-up  NICOTINE ADDICTION No Follow-up on file.

## 2016-10-05 ENCOUNTER — Encounter: Payer: Self-pay | Admitting: Family Medicine

## 2016-10-05 ENCOUNTER — Ambulatory Visit (INDEPENDENT_AMBULATORY_CARE_PROVIDER_SITE_OTHER): Payer: BLUE CROSS/BLUE SHIELD | Admitting: Family Medicine

## 2016-10-05 VITALS — BP 150/100 | HR 49 | Temp 97.7°F | Wt 146.0 lb

## 2016-10-05 DIAGNOSIS — F17200 Nicotine dependence, unspecified, uncomplicated: Secondary | ICD-10-CM

## 2016-10-05 DIAGNOSIS — E782 Mixed hyperlipidemia: Secondary | ICD-10-CM

## 2016-10-05 DIAGNOSIS — I10 Essential (primary) hypertension: Secondary | ICD-10-CM | POA: Diagnosis not present

## 2016-10-05 DIAGNOSIS — Z0000 Encounter for general adult medical examination without abnormal findings: Secondary | ICD-10-CM

## 2016-10-05 DIAGNOSIS — Z09 Encounter for follow-up examination after completed treatment for conditions other than malignant neoplasm: Secondary | ICD-10-CM

## 2016-10-05 DIAGNOSIS — F411 Generalized anxiety disorder: Secondary | ICD-10-CM

## 2016-10-05 DIAGNOSIS — Z4802 Encounter for removal of sutures: Secondary | ICD-10-CM | POA: Diagnosis not present

## 2016-10-05 DIAGNOSIS — F528 Other sexual dysfunction not due to a substance or known physiological condition: Secondary | ICD-10-CM | POA: Diagnosis not present

## 2016-10-05 LAB — LIPID PANEL
CHOL/HDL RATIO: 3
Cholesterol: 172 mg/dL (ref 0–200)
HDL: 53.3 mg/dL (ref >39.00)
LDL CALC: 95 mg/dL (ref 0–99)
NonHDL: 119.17
TRIGLYCERIDES: 119 mg/dL (ref 0.0–149.0)
VLDL: 23.8 mg/dL (ref 0.0–40.0)

## 2016-10-05 LAB — COMPREHENSIVE METABOLIC PANEL
ALT: 17 U/L (ref 0–53)
AST: 22 U/L (ref 0–37)
Albumin: 4.4 g/dL (ref 3.5–5.2)
Alkaline Phosphatase: 66 U/L (ref 39–117)
BILIRUBIN TOTAL: 0.3 mg/dL (ref 0.2–1.2)
BUN: 8 mg/dL (ref 6–23)
CHLORIDE: 105 meq/L (ref 96–112)
CO2: 28 meq/L (ref 19–32)
Calcium: 9.4 mg/dL (ref 8.4–10.5)
Creatinine, Ser: 0.81 mg/dL (ref 0.40–1.50)
GFR: 108.17 mL/min (ref >60.00)
Glucose, Bld: 92 mg/dL (ref 70–99)
Potassium: 4.6 mEq/L (ref 3.5–5.1)
Sodium: 140 mEq/L (ref 135–145)
Total Protein: 6.7 g/dL (ref 6.0–8.3)

## 2016-10-05 LAB — PSA: PSA: 0.38 ng/mL (ref 0.10–4.00)

## 2016-10-05 NOTE — Assessment & Plan Note (Signed)
A little high today but he was nervous about suture removal. No changes made to rxs today. BP Readings from Last 3 Encounters:  10/05/16 (!) 150/100  09/28/16 (!) 162/120  09/22/16 (!) 122/97

## 2016-10-05 NOTE — Assessment & Plan Note (Signed)
No changes made to rxs. 

## 2016-10-05 NOTE — Progress Notes (Signed)
Subjective:   Patient ID: John Peters, male    DOB: 03/07/1971, 46 y.o.   MRN: [id number]  John Peters is a pleasant 46 y.o. year old male who presents to clinic today with ER follow up and  Medication Refill  on 10/05/2016  HPI:  Was seen in the ER on 09/22/16.  Note reviewed.  Had been drinking alcohol, became intoxicated and fell. Hit his left ear on the corner of a dresser after tripping over a stool.  Head CT and cervical spine were unremarkable.  14 sutures placed.  He is here to have these removed today.  Ct Head Wo Contrast  Result Date: 09/22/2016 CLINICAL DATA:  Status post fall against corner of countertop. Injury to ear. Hit side of head. Slurred speech. Concern for cervical spine injury. Initial encounter. EXAM: CT HEAD WITHOUT CONTRAST CT CERVICAL SPINE WITHOUT CONTRAST TECHNIQUE: Multidetector CT imaging of the head and cervical spine was performed following the standard protocol without intravenous contrast. Multiplanar CT image reconstructions of the cervical spine were also generated. COMPARISON:  None. FINDINGS: CT HEAD FINDINGS Brain: No evidence of acute infarction, hemorrhage, hydrocephalus, extra-axial collection or mass lesion/mass effect. Prominence of the sulci suggests mild cortical volume loss. Mild cerebellar atrophy is noted. The brainstem and fourth ventricle are within normal limits. The basal ganglia are unremarkable in appearance. The cerebral hemispheres demonstrate grossly normal gray-white differentiation. No mass effect or midline shift is seen. Vascular: No hyperdense vessel or unexpected calcification. Skull: There is no evidence of fracture; visualized osseous structures are unremarkable in appearance. Sinuses/Orbits: The visualized portions of the orbits are within normal limits. The paranasal sinuses and mastoid air cells are well-aerated. Other: No significant soft tissue abnormalities are seen. CT CERVICAL SPINE FINDINGS Alignment: The  visualized portions of the thyroid gland are unremarkable. Skull base and vertebrae: No acute fracture. No primary bone lesion or focal pathologic process. Soft tissues and spinal canal: No prevertebral fluid or swelling. No visible canal hematoma. Disc levels: There is slight narrowing of the intervertebral disc space at C4-C5, with anterior and posterior disc osteophyte complexes. Remaining intervertebral disc spaces are preserved. Upper chest: The visualized portions of the thyroid gland are unremarkable. Other: The known left ear laceration is difficult to fully characterize on CT. IMPRESSION: 1. No evidence of traumatic intracranial injury or fracture. 2. No evidence of fracture or subluxation along the cervical spine. 3. Known left ear laceration is difficult to fully characterize on CT. 4. Mild cortical volume loss noted. 5. Minimal degenerative change at the mid cervical spine. Electronically Signed   By: Garald Balding M.D.   On: 09/22/2016 04:00   Ct Cervical Spine Wo Contrast  Result Date: 09/22/2016 CLINICAL DATA:  Status post fall against corner of countertop. Injury to ear. Hit side of head. Slurred speech. Concern for cervical spine injury. Initial encounter. EXAM: CT HEAD WITHOUT CONTRAST CT CERVICAL SPINE WITHOUT CONTRAST TECHNIQUE: Multidetector CT imaging of the head and cervical spine was performed following the standard protocol without intravenous contrast. Multiplanar CT image reconstructions of the cervical spine were also generated. COMPARISON:  None. FINDINGS: CT HEAD FINDINGS Brain: No evidence of acute infarction, hemorrhage, hydrocephalus, extra-axial collection or mass lesion/mass effect. Prominence of the sulci suggests mild cortical volume loss. Mild cerebellar atrophy is noted. The brainstem and fourth ventricle are within normal limits. The basal ganglia are unremarkable in appearance. The cerebral hemispheres demonstrate grossly normal gray-white differentiation. No mass effect  or midline shift is seen. Vascular:  No hyperdense vessel or unexpected calcification. Skull: There is no evidence of fracture; visualized osseous structures are unremarkable in appearance. Sinuses/Orbits: The visualized portions of the orbits are within normal limits. The paranasal sinuses and mastoid air cells are well-aerated. Other: No significant soft tissue abnormalities are seen. CT CERVICAL SPINE FINDINGS Alignment: The visualized portions of the thyroid gland are unremarkable. Skull base and vertebrae: No acute fracture. No primary bone lesion or focal pathologic process. Soft tissues and spinal canal: No prevertebral fluid or swelling. No visible canal hematoma. Disc levels: There is slight narrowing of the intervertebral disc space at C4-C5, with anterior and posterior disc osteophyte complexes. Remaining intervertebral disc spaces are preserved. Upper chest: The visualized portions of the thyroid gland are unremarkable. Other: The known left ear laceration is difficult to fully characterize on CT. IMPRESSION: 1. No evidence of traumatic intracranial injury or fracture. 2. No evidence of fracture or subluxation along the cervical spine. 3. Known left ear laceration is difficult to fully characterize on CT. 4. Mild cortical volume loss noted. 5. Minimal degenerative change at the mid cervical spine. Electronically Signed   By: Garald Balding M.D.   On: 09/22/2016 04:00   HTN-  On Quinaretic 20-12.5 mg daily and propranolol 80 mg - 3 tabs tid.  Not having any side effects.  No CP, SOB, HA, lower extremity edema, fatigue, or dizziness.  No blurred vision.  Lab Results  Component Value Date   CREATININE 0.68 09/22/2016     HLD-  .  Pt aware of his risk factors and knows that smoking negatively impacts his cholesterol.  Lab Results  Component Value Date   CHOL 226 (H) 07/29/2015   HDL 49.10 07/29/2015   LDLCALC 151 (H) 07/29/2015   LDLDIRECT 139.5 04/18/2013   TRIG 129.0 07/29/2015    CHOLHDL 5 07/29/2015     Current Outpatient Prescriptions on File Prior to Visit  Medication Sig Dispense Refill  . aspirin 325 MG tablet Take 325 mg by mouth daily.      . Coenzyme Q10 50 MG CAPS Take 1 capsule by mouth daily.    Marland Kitchen lisinopril-hydrochlorothiazide (PRINZIDE,ZESTORETIC) 20-12.5 MG tablet Take 1 tablet by mouth daily. 90 tablet 3  . Omega-3 Fatty Acids (FISH OIL) 1000 MG CAPS Take one by mouth daily    . propranolol (INDERAL) 40 MG tablet TAKE FOUR (4) TABLETS BY MOUTH TWO TIMESA DAY 240 tablet 0  . propranolol (INDERAL) 80 MG tablet TAKE TWO (2) TABLETS BY MOUTH 2 TIMES DAILY 360 tablet 1  . sildenafil (REVATIO) 20 MG tablet TAKE 1 TO 2 TABLETS (20-40MG ) BY MOUTH 60 MINUTES PRIOR TO INTERCOURSE AS DIRECTED *NOT TO EXCEED 5 TABS IN 24 HOURS* 50 tablet 0  . vitamin C (ASCORBIC ACID) 500 MG tablet Take 500 mg by mouth daily.       No current facility-administered medications on file prior to visit.     No Known Allergies  Past Medical History:  Diagnosis Date  . Anxiety   . Hyperlipidemia   . Hypertension     Past Surgical History:  Procedure Laterality Date  . ABLATION OF DYSRHYTHMIC FOCUS     negative  . renal ultrasound  08/1998   normal    Family History  Problem Relation Age of Onset  . Colon polyps Father   . Hypertension Mother   . Hyperlipidemia Mother   . Fibromyalgia Mother   . Cancer Paternal Aunt        breast  Social History   Social History  . Marital status: Single    Spouse name: N/A  . Number of children: 2  . Years of education: N/A   Occupational History  . Machine operator [employer]   Social History Main Topics  . Smoking status: Current Every Day Smoker  . Smokeless tobacco: Never Used  . Alcohol use Yes     Comment: Pt is an every day drinker  . Drug use: No  . Sexual activity: Not on file   Other Topics Concern  . Not on file   Social History Narrative  . No narrative on file   The PMH, PSH, Social History,  Family History, Medications, and allergies have been reviewed in [facility], and have been updated if relevant.   Review of Systems  Constitutional: Negative.   HENT: Negative.   Eyes: Negative.   Respiratory: Negative.   Cardiovascular: Negative.   Gastrointestinal: Negative.   Endocrine: Negative.   Genitourinary: Negative.   Musculoskeletal: Negative.   Neurological: Negative.   Hematological: Negative.   Psychiatric/Behavioral: Negative.   All other systems reviewed and are negative.      Objective:    BP (!) 150/100   Pulse (!) 49   Temp 97.7 F (36.5 C)   Wt 146 lb (66.2 kg)   SpO2 99%   BMI 20.36 kg/m    Physical Exam  Constitutional: He is oriented to person, place, and time. He appears well-developed and well-nourished. No distress.  HENT:  Head: Normocephalic and atraumatic.  Eyes: Conjunctivae are normal.  Neck: Normal range of motion.  Cardiovascular: Normal rate and regular rhythm.   Pulmonary/Chest: Effort normal and breath sounds normal.  Musculoskeletal: Normal range of motion.  Neurological: He is alert and oriented to person, place, and time. No cranial nerve deficit.  Skin: He is not diaphoretic.     Psychiatric: He has a normal mood and affect. His behavior is normal. Judgment and thought content normal.  Nursing note and vitals reviewed.         Assessment & Plan:   Visit for well man health check - Plan: Comprehensive metabolic panel, Lipid panel, PSA  Hospital discharge follow-up  Encounter for removal of sutures  Anxiety state  ERECTILE DYSFUNCTION  Essential hypertension  NICOTINE ADDICTION  Mixed hyperlipidemia No Follow-up on file.

## 2016-10-05 NOTE — Assessment & Plan Note (Signed)
Patient presents for suture removal. The wound is well healed without signs of infection.  The sutures are removed. Wound care and activity instructions given. Return prn.

## 2016-10-05 NOTE — Progress Notes (Signed)
Pre visit review using our clinic review tool, if applicable. No additional management support is needed unless otherwise documented below in the visit note. 

## 2016-10-05 NOTE — Assessment & Plan Note (Signed)
Due for labs today. No changes made to rxs. 

## 2016-11-05 ENCOUNTER — Other Ambulatory Visit: Payer: Self-pay | Admitting: Family Medicine

## 2017-04-26 ENCOUNTER — Other Ambulatory Visit: Payer: Self-pay | Admitting: Family Medicine

## 2017-04-27 ENCOUNTER — Other Ambulatory Visit: Payer: Self-pay | Admitting: *Deleted

## 2017-04-27 MED ORDER — LISINOPRIL-HYDROCHLOROTHIAZIDE 20-12.5 MG PO TABS: 1.0000 | ORAL_TABLET | Freq: Every day | ORAL | 3 refills | Status: DC

## 2017-05-09 ENCOUNTER — Other Ambulatory Visit: Payer: Self-pay

## 2017-05-09 MED ORDER — LISINOPRIL-HYDROCHLOROTHIAZIDE 20-12.5 MG PO TABS: 1.0000 | ORAL_TABLET | Freq: Every day | ORAL | 3 refills | Status: DC

## 2017-09-20 ENCOUNTER — Ambulatory Visit: Payer: BLUE CROSS/BLUE SHIELD | Admitting: Family Medicine

## 2017-09-20 ENCOUNTER — Encounter: Payer: Self-pay | Admitting: Family Medicine

## 2017-09-20 VITALS — BP 160/96 | HR 55 | Temp 98.1°F | Ht 71.0 in | Wt 155.0 lb

## 2017-09-20 DIAGNOSIS — I10 Essential (primary) hypertension: Secondary | ICD-10-CM | POA: Diagnosis not present

## 2017-09-20 DIAGNOSIS — F528 Other sexual dysfunction not due to a substance or known physiological condition: Secondary | ICD-10-CM

## 2017-09-20 DIAGNOSIS — E782 Mixed hyperlipidemia: Secondary | ICD-10-CM | POA: Diagnosis not present

## 2017-09-20 LAB — COMPREHENSIVE METABOLIC PANEL
ALK PHOS: 66 U/L (ref 39–117)
ALT: 13 U/L (ref 0–53)
AST: 17 U/L (ref 0–37)
Albumin: 4.4 g/dL (ref 3.5–5.2)
BILIRUBIN TOTAL: 0.5 mg/dL (ref 0.2–1.2)
BUN: 12 mg/dL (ref 6–23)
CO2: 28 mEq/L (ref 19–32)
CREATININE: 0.74 mg/dL (ref 0.40–1.50)
Calcium: 9.7 mg/dL (ref 8.4–10.5)
Chloride: 102 mEq/L (ref 96–112)
GFR: 119.58 mL/min (ref >60.00)
GLUCOSE: 105 mg/dL — AB (ref 70–99)
Potassium: 4.5 mEq/L (ref 3.5–5.1)
SODIUM: 138 meq/L (ref 135–145)
TOTAL PROTEIN: 7.3 g/dL (ref 6.0–8.3)

## 2017-09-20 LAB — LIPID PANEL
Cholesterol: 218 mg/dL — ABNORMAL HIGH (ref 0–200)
HDL: 62.1 mg/dL (ref >39.00)
LDL Cholesterol: 137 mg/dL — ABNORMAL HIGH (ref 0–99)
NONHDL: 155.93
Total CHOL/HDL Ratio: 4
Triglycerides: 97 mg/dL (ref 0.0–149.0)
VLDL: 19.4 mg/dL (ref 0.0–40.0)

## 2017-09-20 LAB — PSA: PSA: 1.16 ng/mL (ref 0.10–4.00)

## 2017-09-20 MED ORDER — LISINOPRIL-HYDROCHLOROTHIAZIDE 20-12.5 MG PO TABS: 1.0000 | ORAL_TABLET | Freq: Every day | ORAL | 3 refills | Status: DC

## 2017-09-20 MED ORDER — LISINOPRIL-HYDROCHLOROTHIAZIDE 20-25 MG PO TABS
1.0000 | ORAL_TABLET | Freq: Every day | ORAL | 3 refills | Status: DC
Start: 2017-09-20 — End: 2017-09-20

## 2017-09-20 MED ORDER — SILDENAFIL CITRATE 20 MG PO TABS: ORAL_TABLET | ORAL | 0 refills | Status: DC

## 2017-09-20 NOTE — Patient Instructions (Addendum)
Great to see you. I will call you with your lab results from today and you can view them online.   

## 2017-09-20 NOTE — Assessment & Plan Note (Addendum)
Deteriorated. Discussed taking Prinzide in the morning so he does not forget to take it. The patient indicates understanding of these issues and agrees with the plan.

## 2017-09-20 NOTE — Assessment & Plan Note (Signed)
Labs today

## 2017-09-20 NOTE — Addendum Note (Signed)
Addended by: Lucille Passy on: 09/20/2017 12:34 PM   Modules accepted: Orders

## 2017-09-20 NOTE — Addendum Note (Signed)
Addended by: Lucille Passy on: 09/20/2017 12:38 PM   Modules accepted: Orders

## 2017-09-20 NOTE — Assessment & Plan Note (Signed)
Viagra refilled. PSA today.

## 2017-09-20 NOTE — Progress Notes (Addendum)
Subjective:   Patient ID: John Peters, male    DOB: 01-18-1971, 47 y.o.   MRN: [id number]  John Peters is a pleasant 47 y.o. year old male who presents to clinic today with ER follow up and  Follow-up (Patient is here today for a F/U on HTN.  He states that he is doing well on his current regimen of Lisinopril HCT, Propranolol and is needing a refill of these and the Sildenafil.  He is 4.5 hours pp.)  on 09/20/2017  HPI:  HTN-  Deteriorated. Admits to not always taking his Prinzide. On Prinzide 20-12.5 mg daily and propranolol 80 mg - 3 tabs tid.  Not having any side effects.  No CP, SOB, HA, lower extremity edema, fatigue, or dizziness.  No blurred vision.  BP Readings from Last 3 Encounters:  09/20/17 (!) 160/96  10/05/16 (!) 150/100  09/28/16 (!) 162/120     Lab Results  Component Value Date   CREATININE 0.81 10/05/2016     HLD-  Pt aware of his risk factors and knows that smoking negatively impacts his cholesterol.  Not ready to quit.    Lab Results  Component Value Date   CHOL 172 10/05/2016   HDL 53.30 10/05/2016   LDLCALC 95 10/05/2016   LDLDIRECT 139.5 04/18/2013   TRIG 119.0 10/05/2016   CHOLHDL 3 10/05/2016   Asking for viagra refill for ED.  Current Outpatient Medications on File Prior to Visit  Medication Sig Dispense Refill  . aspirin 325 MG tablet Take 325 mg by mouth daily.      Marland Kitchen lisinopril-hydrochlorothiazide (PRINZIDE,ZESTORETIC) 20-12.5 MG tablet Take 1 tablet by mouth daily. 90 tablet 3  . Omega-3 Fatty Acids (FISH OIL) 1000 MG CAPS Take one by mouth daily    . propranolol (INDERAL) 40 MG tablet TAKE FOUR (4) TABLETS BY MOUTH TWO TIMESA DAY 240 tablet 5  . vitamin C (ASCORBIC ACID) 500 MG tablet Take 500 mg by mouth daily.       No current facility-administered medications on file prior to visit.     No Known Allergies  Past Medical History:  Diagnosis Date  . Anxiety   . Hyperlipidemia   . Hypertension     Past Surgical  History:  Procedure Laterality Date  . ABLATION OF DYSRHYTHMIC FOCUS     negative  . renal ultrasound  08/1998   normal    Family History  Problem Relation Age of Onset  . Colon polyps Father   . Hypertension Mother   . Hyperlipidemia Mother   . Fibromyalgia Mother   . Cancer Paternal Aunt        breast    Social History   Socioeconomic History  . Marital status: Single    Spouse name: Not on file  . Number of children: 2  . Years of education: Not on file  . Highest education level: Not on file  Occupational History  . Occupation: IT sales professional: [employer]  . Financial resource strain: Not on file  . Food insecurity:    Worry: Not on file    Inability: Not on file  . Transportation needs:    Medical: Not on file    Non-medical: Not on file  Tobacco Use  . Smoking status: Current Every Day Smoker  . Smokeless tobacco: Never Used  Substance and Sexual Activity  . Alcohol use: Yes    Comment: Pt is an every day drinker  .  Drug use: No  . Sexual activity: Not on file  Lifestyle  . Physical activity:    Days per week: Not on file    Minutes per session: Not on file  . Stress: Not on file  Relationships  . Social connections:    Talks on phone: Not on file    Gets together: Not on file    Attends religious service: Not on file    Active member of club or organization: Not on file    Attends meetings of clubs or organizations: Not on file    Relationship status: Not on file  . Intimate partner violence:    Fear of current or ex partner: Not on file    Emotionally abused: Not on file    Physically abused: Not on file    Forced sexual activity: Not on file  Other Topics Concern  . Not on file  Social History Narrative  . Not on file   The PMH, PSH, Social History, Family History, Medications, and allergies have been reviewed in [facility], and have been updated if relevant.   Review of Systems  Constitutional: Negative.   HENT:  Negative.   Eyes: Negative.   Respiratory: Negative.   Cardiovascular: Negative.   Gastrointestinal: Negative.   Endocrine: Negative.   Genitourinary: Negative.   Musculoskeletal: Negative.   Neurological: Negative.   Hematological: Negative.   Psychiatric/Behavioral: Negative.   All other systems reviewed and are negative.      Objective:    BP (!) 160/96 (BP Location: Left Arm, Cuff Size: Small)   Pulse (!) 55   Temp 98.1 F (36.7 C) (Oral)   Ht 5\' 11"  (1.803 m)   Wt 155 lb (70.3 kg)   SpO2 97%   BMI 21.62 kg/m    Physical Exam  Constitutional: He is oriented to person, place, and time. He appears well-developed and well-nourished. No distress.  HENT:  Head: Normocephalic and atraumatic.  Eyes: Conjunctivae are normal.  Neck: Normal range of motion.  Cardiovascular: Normal rate and regular rhythm.  Pulmonary/Chest: Effort normal and breath sounds normal.  Abdominal: Soft.  Musculoskeletal: Normal range of motion.  Neurological: He is alert and oriented to person, place, and time. No cranial nerve deficit.  Skin: Skin is warm and dry. He is not diaphoretic.     Psychiatric: He has a normal mood and affect. His behavior is normal. Judgment and thought content normal.  Nursing note and vitals reviewed.         Assessment & Plan:   Mixed hyperlipidemia  ERECTILE DYSFUNCTION  Essential hypertension No follow-ups on file.

## 2017-11-02 ENCOUNTER — Other Ambulatory Visit: Payer: Self-pay | Admitting: Family Medicine

## 2017-11-02 NOTE — Telephone Encounter (Signed)
Copied from [place] [id number]. Topic: Quick Communication - Rx Refill/Question >> Nov 02, 2017  2:40 PM Percell Belt A wrote: Medication: propranolol (INDERAL) 40 MG tablet [[id number]]   Has the patient contacted their pharmacy? Yes (Agent: If no, request that the patient contact the pharmacy for the refill.) (Agent: If yes, when and what did the pharmacy advise?)  Preferred Pharmacy (with phone number or street name): [id number] [place], [place] [phone] (Phone)   Agent: Please be advised that RX refills may take up to 3 business days. We ask that you follow-up with your pharmacy.

## 2017-12-07 ENCOUNTER — Other Ambulatory Visit: Payer: Self-pay | Admitting: Family Medicine

## 2017-12-07 MED ORDER — PROPRANOLOL HCL 40 MG PO TABS: ORAL_TABLET | ORAL | 1 refills | Status: DC

## 2017-12-07 NOTE — Telephone Encounter (Signed)
Patient is requesting a 90 day supply sent for the rest of the year, so he doesn't have to call monthly. He is ok with a monthly supply, as long as it is refilled for the rest of the year.  Propranolol refill Last Refill:11/05/17 #216/1 refill Last OV: 09/20/17 PCP: [place]: [id number] [place], [place] [phone] (Phone) [phone] (Fax)

## 2017-12-07 NOTE — Telephone Encounter (Signed)
Copied from [place] [phone]. Topic: Quick Communication - Rx Refill/Question >> Dec 07, 2017 11:47 AM Margot Ables wrote: Medication: propanolol - pt requesting that we send RX in for the remainder of the year so he doesn't have to call monthly. His lisinopril was sent 09/20/17 for 3 month supply with 1 year of refills. He is fine with propanolol in 30 day supply but would like it sent for the rest of the year.  Has the patient contacted their pharmacy? Yes - getting filled last refill Preferred Pharmacy (with phone number or street name): [id number] [place], [place] [phone] (Phone) [phone] (Fax)

## 2018-03-04 ENCOUNTER — Other Ambulatory Visit: Payer: Self-pay | Admitting: Family Medicine

## 2018-05-07 ENCOUNTER — Other Ambulatory Visit: Payer: Self-pay | Admitting: Family Medicine

## 2018-06-02 ENCOUNTER — Other Ambulatory Visit: Payer: Self-pay | Admitting: Family Medicine

## 2018-06-29 ENCOUNTER — Other Ambulatory Visit: Payer: Self-pay | Admitting: Family Medicine

## 2018-06-30 ENCOUNTER — Telehealth: Payer: Self-pay | Admitting: Family Medicine

## 2018-06-30 MED ORDER — LISINOPRIL-HYDROCHLOROTHIAZIDE 20-12.5 MG PO TABS: 1.0000 | ORAL_TABLET | Freq: Every day | ORAL | 3 refills | Status: DC

## 2018-06-30 MED ORDER — PROPRANOLOL HCL 40 MG PO TABS: ORAL_TABLET | ORAL | 1 refills | Status: DC

## 2018-06-30 NOTE — Telephone Encounter (Signed)
Physician on call received request for HTN medications for this patient. Patient coordinator states that the patient requested medication refill because he "fears for his life" if he cannot get it today. Sent. Will forward to PCP for review. Briscoe Deutscher, DO 06/30/2018

## 2018-06-30 NOTE — Telephone Encounter (Signed)
Thank you, Dr. Juleen China.  Selinda Eon, can you please call him first thing tomorrow morning to get him on my schedule for a televisit if appropriate.

## 2018-07-02 NOTE — Telephone Encounter (Signed)
Attempted to call several times and it was busy/thx dmf

## 2018-07-03 NOTE — Telephone Encounter (Signed)
Schedule a Webex or Tele-Visit for any fills/unsuccessful at reaching patient/thx dmf

## 2018-07-09 NOTE — Telephone Encounter (Signed)
Tried to call pt again/busy/thx dmf

## 2018-07-10 NOTE — Telephone Encounter (Signed)
Pt called in to scheduled visit with PCP. Showing in notes that PCP is requesting televisit.   Scheduled pt for 07/25/18 at 10:40- also updated pt's phone number.

## 2018-07-23 ENCOUNTER — Telehealth: Payer: Self-pay

## 2018-07-23 NOTE — Telephone Encounter (Signed)
Pt was rescheduled from 4/16 to 4/15 as requested

## 2018-07-24 ENCOUNTER — Encounter: Payer: Self-pay | Admitting: Family Medicine

## 2018-07-24 ENCOUNTER — Ambulatory Visit (INDEPENDENT_AMBULATORY_CARE_PROVIDER_SITE_OTHER): Payer: BLUE CROSS/BLUE SHIELD | Admitting: Family Medicine

## 2018-07-24 DIAGNOSIS — I10 Essential (primary) hypertension: Secondary | ICD-10-CM

## 2018-07-24 DIAGNOSIS — F411 Generalized anxiety disorder: Secondary | ICD-10-CM

## 2018-07-24 DIAGNOSIS — F528 Other sexual dysfunction not due to a substance or known physiological condition: Secondary | ICD-10-CM

## 2018-07-24 DIAGNOSIS — F17200 Nicotine dependence, unspecified, uncomplicated: Secondary | ICD-10-CM

## 2018-07-24 MED ORDER — PROPRANOLOL HCL 40 MG PO TABS: ORAL_TABLET | ORAL | 0 refills | Status: DC

## 2018-07-24 MED ORDER — LISINOPRIL-HYDROCHLOROTHIAZIDE 20-12.5 MG PO TABS: 1.0000 | ORAL_TABLET | Freq: Every day | ORAL | 1 refills | Status: DC

## 2018-07-24 NOTE — Assessment & Plan Note (Signed)
Per pt, has been under good control and he has been compliant with his medications.  eRx refills sent to pharmacy on file.  He will follow up iwht me in June for labs and BP follow up. The patient indicates understanding of these issues and agrees with the plan.

## 2018-07-24 NOTE — Assessment & Plan Note (Signed)
He is going to call rx assistance or good rx prior to getting refills of his viagra. He has tolerated it well in the past and would like to continue using it as needed. He will call back once he is enrolled in one of these programs and we will get him refills as requested. The patient indicates understanding of these issues and agrees with the plan.

## 2018-07-24 NOTE — Progress Notes (Signed)
Virtual Visit via Video   I connected with John Peters on 07/24/18 at 11:20 AM EDT by a video enabled telemedicine application and verified that I am speaking with the correct person using two identifiers. Location patient: Home Location provider: [facility], Office Persons participating in the virtual visit: Baldo Daub, MD   I discussed the limitations of evaluation and management by telemedicine and the availability of in person appointments. The patient expressed understanding and agreed to proceed.  Subjective:   HPI:   Follow up-  Last saw patient on 09/20/17.  Note reviewed. At tat OV, BP was high at 160/96 but he admitted to not always taking his prinzide.  We refilled his prinzide 20-12.5 mg daily along with his propranolol 40- four tabs by mouth twice daily.Denies any symptoms.   Currently denies any HA, blurred vision, PC or SOB.  We discussed importance of him taking his medications as directed.  He is unsure what his BP is right now but when he ran out of his medications, it was 142/87.  Lab Results  Component Value Date   CREATININE 0.74 09/20/2017   Propranolol also helps with his anxiety.  HLD- he is taking fish oil. Lab Results  Component Value Date   CHOL 218 (H) 09/20/2017   HDL 62.10 09/20/2017   LDLCALC 137 (H) 09/20/2017   LDLDIRECT 139.5 04/18/2013   TRIG 97.0 09/20/2017   CHOLHDL 4 09/20/2017   Tobacco abuse- still not ready to quit smoking.  ED- he is wanting sildenafil refills but will call rx outreach first.  Review of Systems  Constitutional: Negative.   HENT: Negative.   Eyes: Negative.   Respiratory: Negative.   Cardiovascular: Negative.   Gastrointestinal: Negative.   Endocrine: Negative.   Genitourinary: Negative.   Musculoskeletal: Negative.   Skin: Negative.   Allergic/Immunologic: Negative.   Neurological: Negative.   Hematological: Negative.   Psychiatric/Behavioral: Negative.   All other systems  reviewed and are negative.    ROS: See pertinent positives and negatives per HPI.  Patient Active Problem List   Diagnosis Date Noted  . HTN (hypertension) 12/03/2006  . Anxiety state 12/03/2006  . ERECTILE DYSFUNCTION 12/03/2006  . NICOTINE ADDICTION 12/03/2006  . HLD (hyperlipidemia) 12/03/2006    Social History   Tobacco Use  . Smoking status: Current Every Day Smoker  . Smokeless tobacco: Never Used  Substance Use Topics  . Alcohol use: Yes    Comment: Pt is an every day drinker    Current Outpatient Medications:  .  aspirin 325 MG tablet, Take 325 mg by mouth daily.  , Disp: , Rfl:  .  lisinopril-hydrochlorothiazide (PRINZIDE,ZESTORETIC) 20-12.5 MG tablet, Take 1 tablet by mouth daily., Disp: 90 tablet, Rfl: 1 .  Omega-3 Fatty Acids (FISH OIL) 1000 MG CAPS, Take one by mouth daily, Disp: , Rfl:  .  propranolol (INDERAL) 40 MG tablet, Take 4 tabs bid, Disp: 360 tablet, Rfl: 0 .  sildenafil (REVATIO) 20 MG tablet, TAKE 1 TO 2 TABLETS (20-40MG ) BY MOUTH 60 MINUTES PRIOR TO INTERCOURSE AS DIRECTED *NOT TO EXCEED 5 TABS IN 24 HOURS*, Disp: 50 tablet, Rfl: 0 .  vitamin C (ASCORBIC ACID) 500 MG tablet, Take 500 mg by mouth daily.  , Disp: , Rfl:   No Known Allergies  Objective:  There were no vitals taken for this visit.  VITALS: Per patient if applicable, see vitals. GENERAL: Alert, appears well and in no acute distress. HEENT: Atraumatic, conjunctiva clear, no  obvious abnormalities on inspection of external nose and ears. NECK: Normal movements of the head and neck. CARDIOPULMONARY: No increased WOB. Speaking in clear sentences. I:E ratio WNL.  MS: Moves all visible extremities without noticeable abnormality. PSYCH: Pleasant and cooperative, well-groomed. Speech normal rate and rhythm. Affect is appropriate. Insight and judgement are appropriate. Attention is focused, linear, and appropriate.  NEURO: CN grossly intact. Oriented as arrived to appointment on time with no  prompting. Moves both UE equally.  SKIN: No obvious lesions, wounds, erythema, or cyanosis noted on face or hands.  Assessment and Plan:   John Peters was seen today for follow-up.  Diagnoses and all orders for this visit:  Essential hypertension  Anxiety state  NICOTINE ADDICTION  ERECTILE DYSFUNCTION  Other orders -     lisinopril-hydrochlorothiazide (PRINZIDE,ZESTORETIC) 20-12.5 MG tablet; Take 1 tablet by mouth daily. -     propranolol (INDERAL) 40 MG tablet; Take 4 tabs bid    . Reviewed expectations re: course of current medical issues. . Discussed self-management of symptoms. . Outlined signs and symptoms indicating need for more acute intervention. . Patient verbalized understanding and all questions were answered. Marland Kitchen Health Maintenance issues including appropriate healthy diet, exercise, and smoking avoidance were discussed with patient. . See orders for this visit as documented in the electronic medical record.  Arnette Norris, MD 07/24/2018

## 2018-07-24 NOTE — Assessment & Plan Note (Signed)
No ready to quit smoking. The patient was counseled on the dangers of tobacco use, and was advised to quit. Reviewed strategies to maximize success, including removing cigarettes and smoking materials from environment.

## 2018-07-25 ENCOUNTER — Ambulatory Visit: Payer: BLUE CROSS/BLUE SHIELD | Admitting: Family Medicine

## 2018-08-23 ENCOUNTER — Telehealth: Payer: Self-pay | Admitting: Family Medicine

## 2018-08-23 NOTE — Telephone Encounter (Signed)
Copied from [place] [phone]. Topic: Quick Communication - See Telephone Encounter >> Aug 23, 2018  4:47 PM Vernona Rieger wrote: CRM for notification. See Telephone encounter for: 08/23/18 Patient said he needs the number for [facility] to get his sildenafil (REVATIO) 20 MG tablet, he said he lost the number that Dr Deborra Medina gave him a few weeks ago. He said that Dr Deborra Medina told him to have them call her to have it refilled. Please advise.

## 2018-08-26 NOTE — Telephone Encounter (Signed)
LMOVM advising the number/thx dmf

## 2018-09-06 NOTE — Telephone Encounter (Signed)
Pt is requesting that number be resent to his phone. The VM was accidentally deleted. Please advise. Good RX

## 2018-09-29 ENCOUNTER — Other Ambulatory Visit: Payer: Self-pay | Admitting: Family Medicine

## 2018-11-07 ENCOUNTER — Other Ambulatory Visit: Payer: Self-pay | Admitting: Family Medicine

## 2018-11-16 IMAGING — CT CT HEAD W/O CM
4 of 7 series · 15 of 47 positions shown, 16 images · non-contrast
Comparison: None.

CLINICAL DATA: Status post fall against corner of countertop.
Injury to ear. Hit side of head. Slurred speech. Concern for
cervical spine injury. Initial encounter.

EXAM:
CT HEAD WITHOUT CONTRAST
CT CERVICAL SPINE WITHOUT CONTRAST
TECHNIQUE: Multidetector CT imaging of the head and cervical spine was
performed following the standard protocol without intravenous
contrast. Multiplanar CT image reconstructions of the cervical spine
were also generated.

[Series 4: head wo · axial · 0.44mm/px · z∈[-11,+39]mm · 2 of 30 slices shown, 3 images]
[im 10/30  brain]
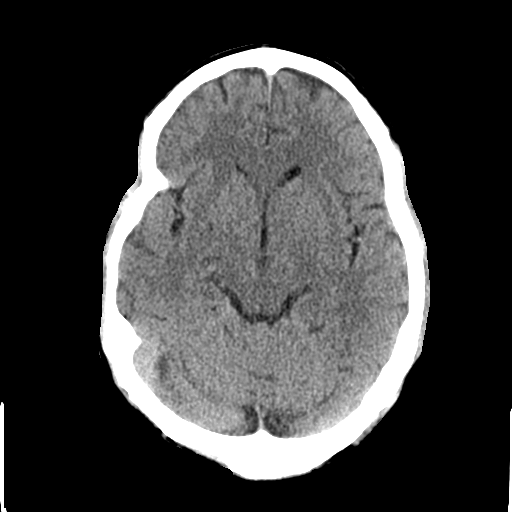
[im 10/30  bone]
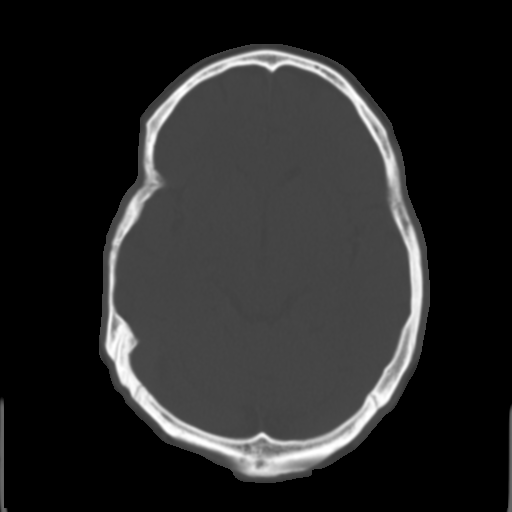
[im 20/30  brain]
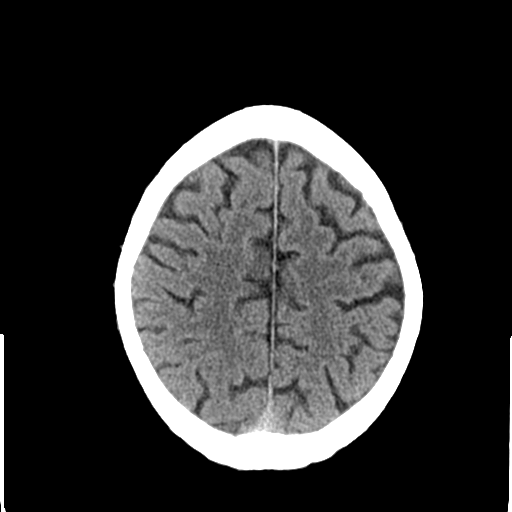

[Series 5: coronal soft tissue · coronal · 0.39mm/px · 3 of 81 slices shown]
[im 17/81  brain]
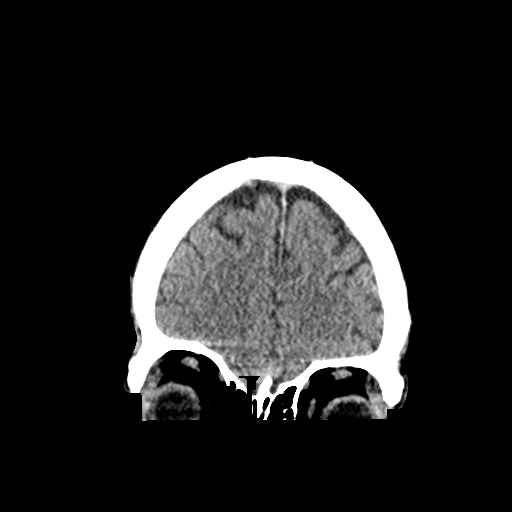
[im 33/81  brain]
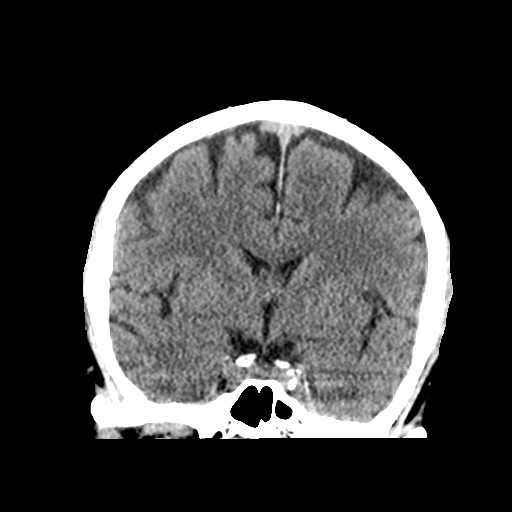
[im 49/81  brain]
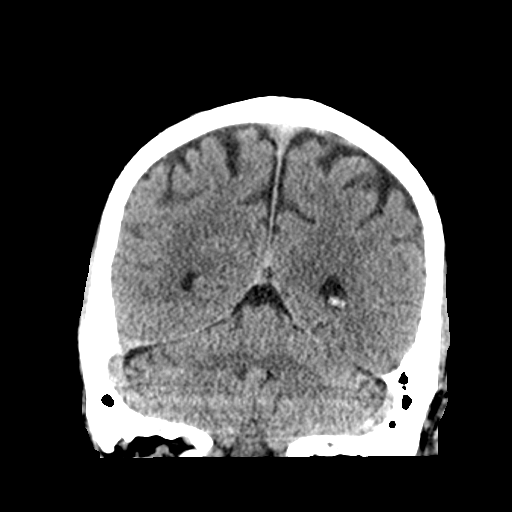

[Series 6: sagittal soft tissue · sagittal · 0.38mm/px · 2 of 65 slices shown]
[im 22/65  brain]
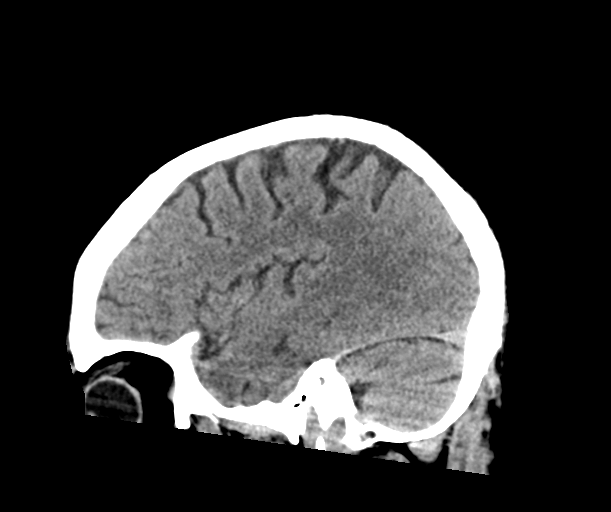
[im 43/65  brain]
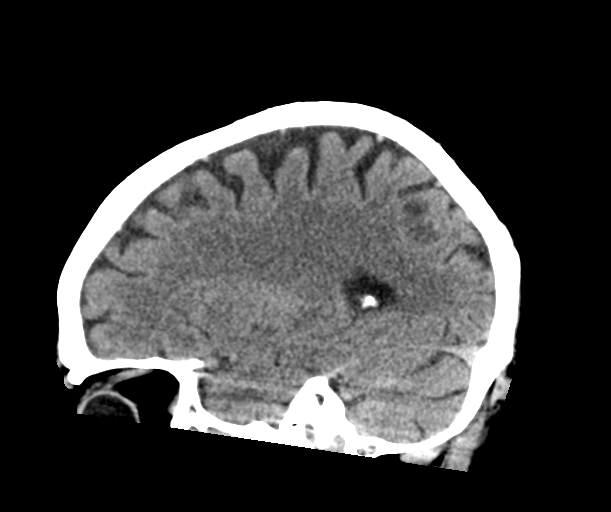

[Series 10: orthogonal bone · axial · 0.47mm/px · z∈[-211,-53]mm · 8 of 95 slices shown]
[im 8/95  bone]
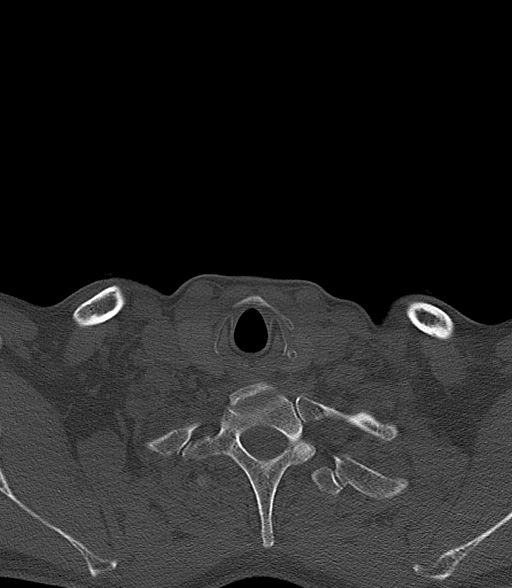
[im 24/95  bone]
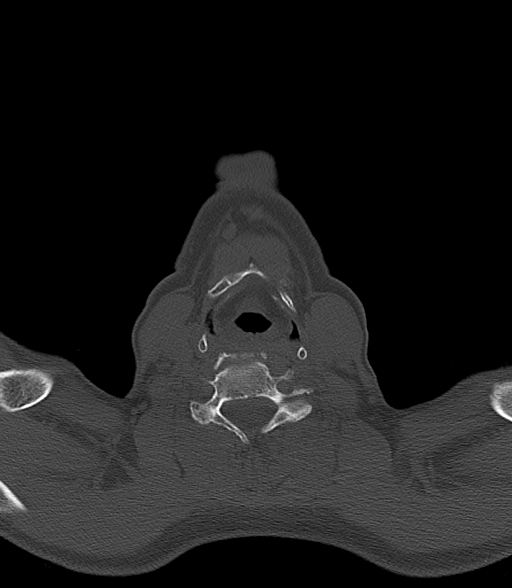
[im 32/95  bone]
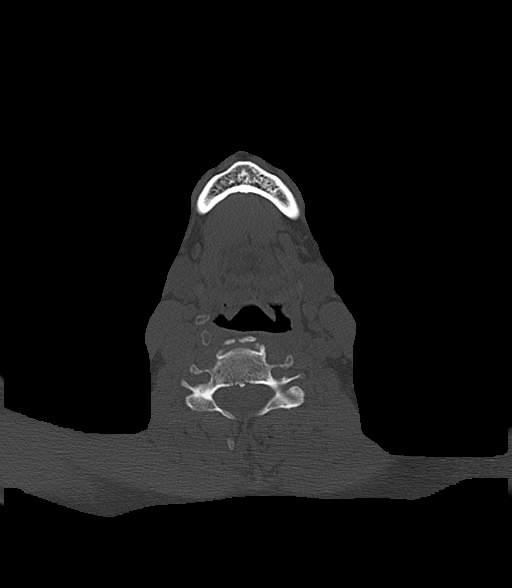
[im 40/95  bone]
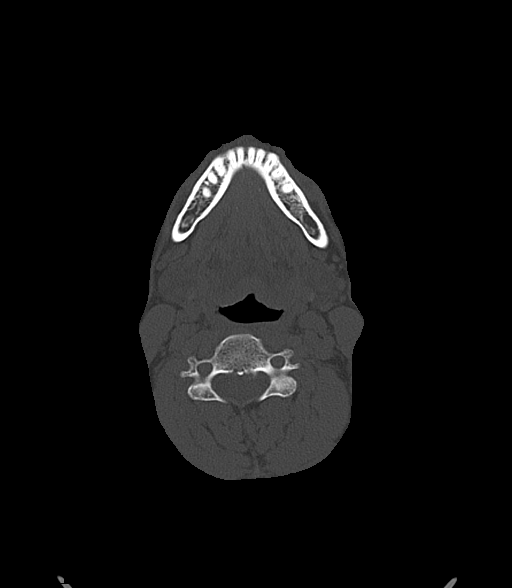
[im 55/95  bone]
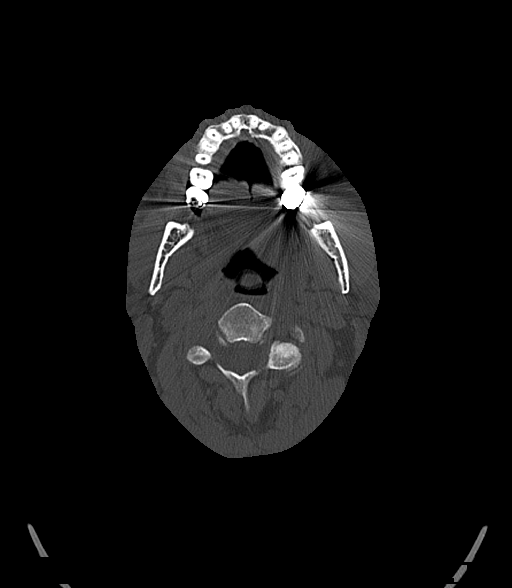
[im 63/95  bone]
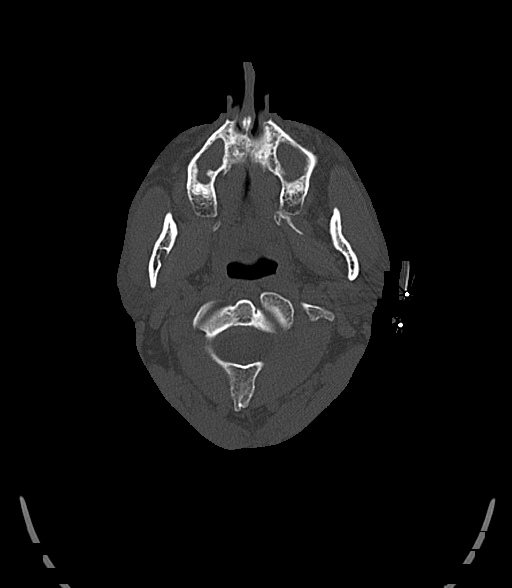
[im 71/95  bone]
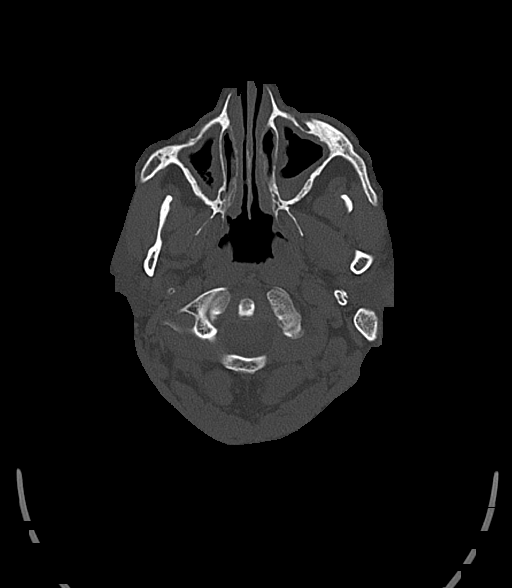
[im 87/95  bone]
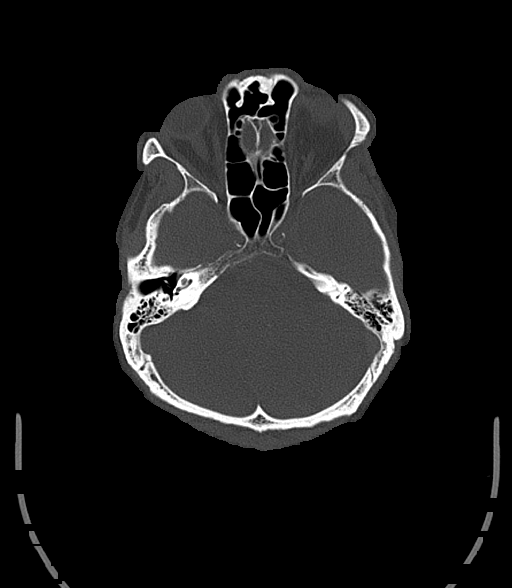

[15 of 47 positions shown; findings below may reference images not displayed]

FINDINGS: CT HEAD FINDINGS

Brain: No evidence of acute infarction, hemorrhage, hydrocephalus,
extra-axial collection or mass lesion/mass effect.

Prominence of the sulci suggests mild cortical volume loss. Mild
cerebellar atrophy is noted.

The brainstem and fourth ventricle are within normal limits. The
basal ganglia are unremarkable in appearance. The cerebral
hemispheres demonstrate grossly normal gray-white differentiation.
No mass effect or midline shift is seen.

Vascular: No hyperdense vessel or unexpected calcification.

Skull: There is no evidence of fracture; visualized osseous
structures are unremarkable in appearance.

Sinuses/Orbits: The visualized portions of the orbits are within
normal limits. The paranasal sinuses and mastoid air cells are
well-aerated.

Other: No significant soft tissue abnormalities are seen.

CT CERVICAL SPINE FINDINGS

Alignment: The visualized portions of the thyroid gland are
unremarkable.

Skull base and vertebrae: No acute fracture. No primary bone lesion
or focal pathologic process.

Soft tissues and spinal canal: No prevertebral fluid or swelling. No
visible canal hematoma.

Disc levels: There is slight narrowing of the intervertebral disc
space at C4-C5, with anterior and posterior disc osteophyte
complexes. Remaining intervertebral disc spaces are preserved.

Upper chest: The visualized portions of the thyroid gland are
unremarkable.

Other: The known left ear laceration is difficult to fully
characterize on CT.
IMPRESSION: 1. No evidence of traumatic intracranial injury or fracture.
2. No evidence of fracture or subluxation along the cervical spine.
3. Known left ear laceration is difficult to fully characterize on
CT.
4. Mild cortical volume loss noted.
5. Minimal degenerative change at the mid cervical spine.

## 2019-02-24 ENCOUNTER — Other Ambulatory Visit: Payer: Self-pay | Admitting: Family Medicine

## 2019-02-28 ENCOUNTER — Other Ambulatory Visit: Payer: Self-pay | Admitting: Family Medicine

## 2019-02-28 NOTE — Telephone Encounter (Signed)
Copied from [place] [phone]. Topic: Quick Communication - Rx Refill/Question >> Feb 28, 2019  4:49 PM Mcneil, Ja-Kwan wrote: Medication: lisinopril-hydrochlorothiazide (PRINZIDE,ZESTORETIC) 20-12.5 MG tablet AND propranolol (INDERAL) 40 MG tablet  Has the patient contacted their pharmacy? no  Preferred Pharmacy (with phone number [facility] street name): [id number] [place], [place] [phone] (Phone)  [phone] (Fax)  Agent: Please be advised that RX refills may take up to 3 business days. We ask that you follow-up with your pharmacy.

## 2019-02-28 NOTE — Telephone Encounter (Signed)
Forwarding medication refill requesting to PCP for review.

## 2019-03-01 MED ORDER — LISINOPRIL-HYDROCHLOROTHIAZIDE 20-12.5 MG PO TABS: 1.0000 | ORAL_TABLET | Freq: Every day | ORAL | 1 refills | Status: DC

## 2019-03-01 MED ORDER — PROPRANOLOL HCL 40 MG PO TABS: 160.0000 mg | ORAL_TABLET | Freq: Two times a day (BID) | ORAL | 1 refills | Status: DC

## 2019-08-19 ENCOUNTER — Other Ambulatory Visit: Payer: Self-pay

## 2019-08-19 MED ORDER — PROPRANOLOL HCL 40 MG PO TABS: ORAL_TABLET | ORAL | 0 refills | Status: DC

## 2019-09-18 ENCOUNTER — Encounter: Payer: Self-pay | Admitting: Family Medicine

## 2019-09-18 ENCOUNTER — Ambulatory Visit: Payer: BLUE CROSS/BLUE SHIELD | Admitting: Family Medicine

## 2019-09-18 ENCOUNTER — Other Ambulatory Visit: Payer: Self-pay

## 2019-09-18 VITALS — BP 168/108 | HR 49 | Temp 98.1°F | Ht 71.0 in | Wt 158.0 lb

## 2019-09-18 DIAGNOSIS — Z720 Tobacco use: Secondary | ICD-10-CM

## 2019-09-18 DIAGNOSIS — Z114 Encounter for screening for human immunodeficiency virus [HIV]: Secondary | ICD-10-CM

## 2019-09-18 DIAGNOSIS — E782 Mixed hyperlipidemia: Secondary | ICD-10-CM

## 2019-09-18 DIAGNOSIS — Z7289 Other problems related to lifestyle: Secondary | ICD-10-CM | POA: Insufficient documentation

## 2019-09-18 DIAGNOSIS — Z1159 Encounter for screening for other viral diseases: Secondary | ICD-10-CM

## 2019-09-18 DIAGNOSIS — Z789 Other specified health status: Secondary | ICD-10-CM | POA: Insufficient documentation

## 2019-09-18 DIAGNOSIS — F1090 Alcohol use, unspecified, uncomplicated: Secondary | ICD-10-CM

## 2019-09-18 DIAGNOSIS — I10 Essential (primary) hypertension: Secondary | ICD-10-CM

## 2019-09-18 LAB — COMPREHENSIVE METABOLIC PANEL
ALT: 18 U/L (ref 0–53)
AST: 24 U/L (ref 0–37)
Albumin: 4.4 g/dL (ref 3.5–5.2)
Alkaline Phosphatase: 83 U/L (ref 39–117)
BUN: 14 mg/dL (ref 6–23)
CO2: 29 mEq/L (ref 19–32)
Calcium: 9.8 mg/dL (ref 8.4–10.5)
Chloride: 100 mEq/L (ref 96–112)
Creatinine, Ser: 0.99 mg/dL (ref 0.40–1.50)
GFR: 79.76 mL/min (ref >60.00)
Glucose, Bld: 117 mg/dL — ABNORMAL HIGH (ref 70–99)
Potassium: 5.2 mEq/L — ABNORMAL HIGH (ref 3.5–5.1)
Sodium: 137 mEq/L (ref 135–145)
Total Bilirubin: 0.7 mg/dL (ref 0.2–1.2)
Total Protein: 7.4 g/dL (ref 6.0–8.3)

## 2019-09-18 LAB — LIPID PANEL
Cholesterol: 182 mg/dL (ref 0–200)
HDL: 55.2 mg/dL (ref >39.00)
LDL Cholesterol: 114 mg/dL — ABNORMAL HIGH (ref 0–99)
NonHDL: 126.62
Total CHOL/HDL Ratio: 3
Triglycerides: 65 mg/dL (ref 0.0–149.0)
VLDL: 13 mg/dL (ref 0.0–40.0)

## 2019-09-18 LAB — TSH: TSH: 1.04 u[IU]/mL (ref 0.35–4.50)

## 2019-09-18 LAB — CBC
HCT: 44.7 % (ref 39.0–52.0)
Hemoglobin: 15.1 g/dL (ref 13.0–17.0)
MCHC: 33.7 g/dL (ref 30.0–36.0)
MCV: 102 fl — ABNORMAL HIGH (ref 78.0–100.0)
Platelets: 249 10*3/uL (ref 150.0–400.0)
RBC: 4.39 Mil/uL (ref 4.22–5.81)
RDW: 13.3 % (ref 11.5–15.5)
WBC: 10 10*3/uL (ref 4.0–10.5)

## 2019-09-18 MED ORDER — LISINOPRIL-HYDROCHLOROTHIAZIDE 20-12.5 MG PO TABS: 1.0000 | ORAL_TABLET | Freq: Every day | ORAL | 1 refills | Status: DC

## 2019-09-18 MED ORDER — ATORVASTATIN CALCIUM 10 MG PO TABS: 10.0000 mg | ORAL_TABLET | Freq: Every day | ORAL | 3 refills | Status: DC

## 2019-09-18 MED ORDER — AMLODIPINE BESYLATE 5 MG PO TABS: 5.0000 mg | ORAL_TABLET | Freq: Every day | ORAL | 1 refills | Status: DC

## 2019-09-18 MED ORDER — PROPRANOLOL HCL 80 MG PO TABS: 80.0000 mg | ORAL_TABLET | Freq: Two times a day (BID) | ORAL | 0 refills | Status: DC

## 2019-09-18 NOTE — Addendum Note (Signed)
Addended by: Lesleigh Noe on: 09/18/2019 04:37 PM   Modules accepted: Orders

## 2019-09-18 NOTE — Assessment & Plan Note (Signed)
Offered nicotine replacement readiness to quit 4/10. He wants to work on cutting back on his own, not fully ready. He will consider using nicotine gum prn to help

## 2019-09-18 NOTE — Assessment & Plan Note (Signed)
Heavy daily alcohol use w/o evidence of dependency and no withdrawal. Encouraged cutting back, he is not ready at this time.

## 2019-09-18 NOTE — Assessment & Plan Note (Signed)
Discussed elevated ASCVD. Will repeat lipids today - he is agreeable to starting statin if lipids still high.

## 2019-09-18 NOTE — Assessment & Plan Note (Signed)
BP severely elevated today. On more than recommended amount of propranolol so will decrease to 80 mg BID. Add amlodipine. Anticipate he will likely need to increase lisinopril/HCTZ and or consider spironolactone if BP not improving. Reports normal numbers at home - but unable to provide specifics so he will bring a log to his next visit. Repeat after 10-15 minutes of rest in the office was the same.

## 2019-09-18 NOTE — Progress Notes (Signed)
Subjective:     John Peters is a 49 y.o. male presenting for Medication Management     HPI  #HTN - reports that he checks BP at home - reports only slightly above normal range - taking propranolol 160 mg BID - notes started 20+ years ago - also taking lisinopril-HCTZ in the morning - no ha, cp,  - endorses occasional sob - burning sensation with deep breath -- was after he had covid - this was primarily during the cold winter  #HLD - tries to watch cholesterol - last year was going out for [place]   #heavy alcohol use - aware it is not good - no hx of withdrawal - does not need eye opener  #Tobacco use - knows he needs to cut back - has tried in the past - hoping to slowly cut back  Review of Systems   Social History   Tobacco Use  Smoking Status Current Every Day Smoker  . Packs/day: 1.00  . Years: 25.00  . Pack years: 25.00  . Start date: 1995  Smokeless Tobacco Never Used        Objective:    BP Readings from Last 3 Encounters:  09/18/19 (!) 168/108  09/20/17 (!) 160/96  10/05/16 (!) 150/100   Wt Readings from Last 3 Encounters:  09/18/19 158 lb (71.7 kg)  09/20/17 155 lb (70.3 kg)  10/05/16 146 lb (66.2 kg)    BP (!) 168/108   Pulse (!) 49   Temp 98.1 F (36.7 C) (Temporal)   Ht 5\' 11"  (1.803 m)   Wt 158 lb (71.7 kg)   SpO2 99%   BMI 22.04 kg/m    Physical Exam Constitutional:      Appearance: Normal appearance. He is not ill-appearing or diaphoretic.  HENT:     Right Ear: External ear normal.     Left Ear: External ear normal.     Nose: Nose normal.  Eyes:     General: No scleral icterus.    Extraocular Movements: Extraocular movements intact.     Conjunctiva/sclera: Conjunctivae normal.  Cardiovascular:     Rate and Rhythm: Normal rate and regular rhythm.     Heart sounds: No murmur heard.   Pulmonary:     Effort: Pulmonary effort is normal. No respiratory distress.     Breath sounds: Normal breath sounds. No  wheezing.  Musculoskeletal:     Cervical back: Neck supple.  Skin:    General: Skin is warm and dry.  Neurological:     Mental Status: He is alert. Mental status is at baseline.  Psychiatric:        Mood and Affect: Mood normal.     The 10-year ASCVD risk score [name], et al., 2013) is: 12.8%   Values used to calculate the score:     Age: 66 years     Sex: Male     Is Non-Hispanic African American: No     Diabetic: No     Tobacco smoker: Yes     Systolic Blood Pressure: 235 mmHg     Is BP treated: Yes     HDL Cholesterol: 62.1 mg/dL     Total Cholesterol: 218 mg/dL       Assessment & Plan:   Problem List Items Addressed This Visit      Cardiovascular and Mediastinum   HTN (hypertension) - Primary    BP severely elevated today. On more than recommended amount of propranolol so will decrease  to 80 mg BID. Add amlodipine. Anticipate he will likely need to increase lisinopril/HCTZ and or consider spironolactone if BP not improving. Reports normal numbers at home - but unable to provide specifics so he will bring a log to his next visit. Repeat after 10-15 minutes of rest in the office was the same.       Relevant Medications   aspirin EC 81 MG tablet   propranolol (INDERAL) 80 MG tablet   lisinopril-hydrochlorothiazide (ZESTORETIC) 20-12.5 MG tablet   amLODipine (NORVASC) 5 MG tablet   Other Relevant Orders   Comprehensive metabolic panel   Lipid panel   CBC   TSH     Other   Tobacco abuse    Offered nicotine replacement readiness to quit 4/10. He wants to work on cutting back on his own, not fully ready. He will consider using nicotine gum prn to help      HLD (hyperlipidemia)    Discussed elevated ASCVD. Will repeat lipids today - he is agreeable to starting statin if lipids still high.       Relevant Medications   aspirin EC 81 MG tablet   propranolol (INDERAL) 80 MG tablet   lisinopril-hydrochlorothiazide (ZESTORETIC) 20-12.5 MG tablet   amLODipine  (NORVASC) 5 MG tablet   Alcohol use    Heavy daily alcohol use w/o evidence of dependency and no withdrawal. Encouraged cutting back, he is not ready at this time.        Other Visit Diagnoses    Encounter for hepatitis C screening test for low risk patient       Relevant Orders   Hepatitis C antibody   Screening for HIV (human immunodeficiency virus)       Relevant Orders   HIV Antibody (routine testing w rflx)       Return in about 6 weeks (around 10/30/2019).  Lesleigh Noe, MD

## 2019-09-18 NOTE — Patient Instructions (Addendum)
#   high blood pressure - Start taking Propranolol 80 mg twice daily  - continue the lisinopril - HCTZ medication - start Amlodipine 5 mg daily  Keep a log of your blood pressure and bring with your appointment  Your blood pressure high.   High blood pressure increases your risk for heart attack and stroke.    Please check your blood pressure 2-4 times a week.   To check your blood pressure 1) Sit in a quiet and relaxed place for 5 minutes 2) Make sure your feet are flat on the ground 3) Consider checking first thing in the morning   Normal blood pressure is less than 140/90 Ideally you blood pressure should be around 120/80  Other ways you can reduce your blood pressure:  1) Regular exercise -- Try to get 150 minutes (30 minutes, 5 days a week) of moderate to vigorous aerobic excercise -- Examples: brisk walking (2.5 miles per hour), water aerobics, dancing, gardening, tennis, biking slower than 10 miles per hour 2) DASH Diet - low fat meats, more fresh fruits and vegetables, whole grains, low salt 3) Quit smoking if you smoke 4) Loose 5-10% of your body weight

## 2019-09-19 LAB — HIV ANTIBODY (ROUTINE TESTING W REFLEX): HIV 1&2 Ab, 4th Generation: NONREACTIVE

## 2019-09-19 LAB — HEPATITIS C ANTIBODY
Hepatitis C Ab: NONREACTIVE
SIGNAL TO CUT-OFF: 0.02 (ref <1.00)

## 2019-10-20 DIAGNOSIS — Z20822 Contact with and (suspected) exposure to covid-19: Secondary | ICD-10-CM | POA: Diagnosis not present

## 2019-10-30 ENCOUNTER — Other Ambulatory Visit: Payer: Self-pay

## 2019-10-30 ENCOUNTER — Encounter: Payer: Self-pay | Admitting: Family Medicine

## 2019-10-30 ENCOUNTER — Ambulatory Visit: Payer: BLUE CROSS/BLUE SHIELD | Admitting: Family Medicine

## 2019-10-30 VITALS — BP 110/80 | HR 68 | Temp 98.0°F | Ht 70.5 in | Wt 151.2 lb

## 2019-10-30 DIAGNOSIS — Z122 Encounter for screening for malignant neoplasm of respiratory organs: Secondary | ICD-10-CM

## 2019-10-30 DIAGNOSIS — Z720 Tobacco use: Secondary | ICD-10-CM | POA: Diagnosis not present

## 2019-10-30 DIAGNOSIS — I10 Essential (primary) hypertension: Secondary | ICD-10-CM | POA: Diagnosis not present

## 2019-10-30 DIAGNOSIS — Z1211 Encounter for screening for malignant neoplasm of colon: Secondary | ICD-10-CM

## 2019-10-30 DIAGNOSIS — Z789 Other specified health status: Secondary | ICD-10-CM

## 2019-10-30 DIAGNOSIS — Z7289 Other problems related to lifestyle: Secondary | ICD-10-CM

## 2019-10-30 NOTE — Patient Instructions (Addendum)
Blood pressure is looking great! Continue your medication  Referral for lung cancer screening  Stool test  Try to cut back on alcohol use  Continue to work on cutting back on tobacco use

## 2019-10-30 NOTE — Assessment & Plan Note (Signed)
Lung cancer screening referral based on risk. And pt continues to work on reducing use

## 2019-10-30 NOTE — Assessment & Plan Note (Signed)
Encouraged cutting back on alcohol use. Discussed weekly limit and risk for liver disease. Recent LFTs normal. Pt will consider cutting back.

## 2019-10-30 NOTE — Progress Notes (Signed)
   Subjective:     John Peters is a 49 y.o. male presenting for Establish Care     HPI   #HTN - doing well - home monitoring is also low  #Alcohol use - drinks about 48 beers/week - no eye opener - goes to work without any issues - no black outs  Review of Systems  09/18/2019: Clinic - HTN - decreased propranolol, Start amlodipine. Bring log. Started atorvastatin  Social History   Tobacco Use  Smoking Status Current Every Day Smoker  . Packs/day: 1.00  . Years: 25.00  . Pack years: 25.00  . Start date: 1995  Smokeless Tobacco Never Used        Objective:    BP Readings from Last 3 Encounters:  10/30/19 110/80  09/18/19 (!) 168/108  09/20/17 (!) 160/96   Wt Readings from Last 3 Encounters:  10/30/19 151 lb 4 oz (68.6 kg)  09/18/19 158 lb (71.7 kg)  09/20/17 155 lb (70.3 kg)    BP 110/80   Pulse 68   Temp 98 F (36.7 C) (Temporal)   Ht 5' 10.5" (1.791 m)   Wt 151 lb 4 oz (68.6 kg)   SpO2 93%   BMI 21.40 kg/m    Physical Exam Constitutional:      Appearance: Normal appearance. He is not ill-appearing or diaphoretic.  HENT:     Right Ear: External ear normal.     Left Ear: External ear normal.  Eyes:     General: No scleral icterus.    Extraocular Movements: Extraocular movements intact.     Conjunctiva/sclera: Conjunctivae normal.  Cardiovascular:     Rate and Rhythm: Normal rate.  Pulmonary:     Effort: Pulmonary effort is normal.  Musculoskeletal:     Cervical back: Neck supple.  Skin:    General: Skin is warm and dry.  Neurological:     Mental Status: He is alert. Mental status is at baseline.  Psychiatric:        Mood and Affect: Mood normal.           Assessment & Plan:   Problem List Items Addressed This Visit      Cardiovascular and Mediastinum   HTN (hypertension) - Primary    BP significantly improved. Continue medication. Discussed if getting lightheaded to return to consider decreasing medication         Other   Tobacco abuse    Lung cancer screening referral based on risk. And pt continues to work on reducing use      Alcohol use    Encouraged cutting back on alcohol use. Discussed weekly limit and risk for liver disease. Recent LFTs normal. Pt will consider cutting back.        Other Visit Diagnoses    Colon cancer screening       Relevant Orders   Fecal occult blood, imunochemical   Encounter for screening for lung cancer       Relevant Orders   Ambulatory Referral for Lung Cancer Scre       Return in about 1 year (around 10/29/2020).  Lesleigh Noe, MD  This visit occurred during the SARS-CoV-2 public health emergency.  Safety protocols were in place, including screening questions prior to the visit, additional usage of staff PPE, and extensive cleaning of exam room while observing appropriate contact time as indicated for disinfecting solutions.

## 2019-10-30 NOTE — Assessment & Plan Note (Signed)
BP significantly improved. Continue medication. Discussed if getting lightheaded to return to consider decreasing medication

## 2019-11-13 ENCOUNTER — Telehealth: Payer: Self-pay | Admitting: *Deleted

## 2019-11-13 NOTE — Telephone Encounter (Signed)
Received referral for low dose lung cancer screening CT scan. Message left at phone number listed in EMR for patient to call me back to facilitate scheduling scan.  

## 2019-11-18 ENCOUNTER — Other Ambulatory Visit: Payer: Self-pay | Admitting: Family Medicine

## 2019-12-19 ENCOUNTER — Telehealth: Payer: Self-pay

## 2019-12-19 NOTE — Telephone Encounter (Signed)
Contacted patient for lung CT screening clinic after receiving referral from [facility].  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  He is interested and asked for [place] to call him back.  Our clinic will call patient back at a later time.

## 2019-12-21 ENCOUNTER — Telehealth: Payer: Self-pay

## 2019-12-21 NOTE — Telephone Encounter (Signed)
Contacted patient again today to talk to patient about lung CT screening clinic.  Message left for patient to call Burgess Estelle, lung navigator to discuss services and schedule CT scan.

## 2019-12-31 ENCOUNTER — Encounter: Payer: Self-pay | Admitting: *Deleted

## 2019-12-31 ENCOUNTER — Other Ambulatory Visit: Payer: Self-pay | Admitting: Family Medicine

## 2019-12-31 DIAGNOSIS — I10 Essential (primary) hypertension: Secondary | ICD-10-CM

## 2020-03-10 ENCOUNTER — Other Ambulatory Visit: Payer: Self-pay | Admitting: Family Medicine

## 2020-03-10 DIAGNOSIS — I10 Essential (primary) hypertension: Secondary | ICD-10-CM

## 2020-05-06 ENCOUNTER — Telehealth: Payer: Self-pay

## 2020-05-06 NOTE — Telephone Encounter (Signed)
Appreciate the update 

## 2020-05-06 NOTE — Telephone Encounter (Signed)
Called pt and left message to call clinic.  Pt returned IFOB test kit from July 2021 today- the test has expired.  Please inform pt and have him return to get another test kit if he wishes to.

## 2020-05-28 DIAGNOSIS — S92332A Displaced fracture of third metatarsal bone, left foot, initial encounter for closed fracture: Secondary | ICD-10-CM | POA: Diagnosis not present

## 2020-05-28 DIAGNOSIS — S92322A Displaced fracture of second metatarsal bone, left foot, initial encounter for closed fracture: Secondary | ICD-10-CM | POA: Diagnosis not present

## 2020-05-28 DIAGNOSIS — S92342A Displaced fracture of fourth metatarsal bone, left foot, initial encounter for closed fracture: Secondary | ICD-10-CM | POA: Diagnosis not present

## 2020-06-04 DIAGNOSIS — S92332A Displaced fracture of third metatarsal bone, left foot, initial encounter for closed fracture: Secondary | ICD-10-CM | POA: Diagnosis not present

## 2020-06-04 DIAGNOSIS — S92342A Displaced fracture of fourth metatarsal bone, left foot, initial encounter for closed fracture: Secondary | ICD-10-CM | POA: Diagnosis not present

## 2020-06-04 DIAGNOSIS — S92322A Displaced fracture of second metatarsal bone, left foot, initial encounter for closed fracture: Secondary | ICD-10-CM | POA: Diagnosis not present

## 2020-06-25 DIAGNOSIS — S92342A Displaced fracture of fourth metatarsal bone, left foot, initial encounter for closed fracture: Secondary | ICD-10-CM | POA: Diagnosis not present

## 2020-06-25 DIAGNOSIS — S92322A Displaced fracture of second metatarsal bone, left foot, initial encounter for closed fracture: Secondary | ICD-10-CM | POA: Diagnosis not present

## 2020-06-25 DIAGNOSIS — S92332A Displaced fracture of third metatarsal bone, left foot, initial encounter for closed fracture: Secondary | ICD-10-CM | POA: Diagnosis not present

## 2020-07-16 DIAGNOSIS — S92332A Displaced fracture of third metatarsal bone, left foot, initial encounter for closed fracture: Secondary | ICD-10-CM | POA: Diagnosis not present

## 2020-07-16 DIAGNOSIS — S92342A Displaced fracture of fourth metatarsal bone, left foot, initial encounter for closed fracture: Secondary | ICD-10-CM | POA: Diagnosis not present

## 2020-07-16 DIAGNOSIS — S92322A Displaced fracture of second metatarsal bone, left foot, initial encounter for closed fracture: Secondary | ICD-10-CM | POA: Diagnosis not present

## 2020-08-27 ENCOUNTER — Telehealth: Payer: Self-pay | Admitting: Family Medicine

## 2020-08-27 NOTE — Telephone Encounter (Signed)
  LAST APPOINTMENT DATE: 05/06/2020   NEXT APPOINTMENT DATE:@Visit  date not found  MEDICATION: atorvastatin,   PHARMACY: [facility] [place]  Let patient know to contact pharmacy at the end of the day to make sure medication is ready.  Please notify patient to allow 48-72 hours to process  Encourage patient to contact the pharmacy for refills or they can request refills through [place]:   LAST REFILL:  QTY:  REFILL DATE:    OTHER COMMENTS:    Okay for refill?  Please advise

## 2020-08-30 NOTE — Telephone Encounter (Signed)
Pt is scheduled for medication refill appt on 09/02/20. Will refill then.

## 2020-09-02 ENCOUNTER — Other Ambulatory Visit: Payer: Self-pay

## 2020-09-02 ENCOUNTER — Ambulatory Visit: Payer: BLUE CROSS/BLUE SHIELD | Admitting: Family Medicine

## 2020-09-02 VITALS — BP 118/78 | HR 52 | Temp 97.7°F | Ht 71.0 in | Wt 151.0 lb

## 2020-09-02 DIAGNOSIS — R7309 Other abnormal glucose: Secondary | ICD-10-CM | POA: Diagnosis not present

## 2020-09-02 DIAGNOSIS — Z1211 Encounter for screening for malignant neoplasm of colon: Secondary | ICD-10-CM | POA: Diagnosis not present

## 2020-09-02 DIAGNOSIS — I10 Essential (primary) hypertension: Secondary | ICD-10-CM

## 2020-09-02 DIAGNOSIS — E782 Mixed hyperlipidemia: Secondary | ICD-10-CM | POA: Diagnosis not present

## 2020-09-02 DIAGNOSIS — Z720 Tobacco use: Secondary | ICD-10-CM

## 2020-09-02 DIAGNOSIS — Z23 Encounter for immunization: Secondary | ICD-10-CM

## 2020-09-02 LAB — COMPREHENSIVE METABOLIC PANEL
ALT: 20 U/L (ref 0–53)
AST: 27 U/L (ref 0–37)
Albumin: 4.7 g/dL (ref 3.5–5.2)
Alkaline Phosphatase: 102 U/L (ref 39–117)
BUN: 13 mg/dL (ref 6–23)
CO2: 27 mEq/L (ref 19–32)
Calcium: 9.9 mg/dL (ref 8.4–10.5)
Chloride: 100 mEq/L (ref 96–112)
Creatinine, Ser: 1.01 mg/dL (ref 0.40–1.50)
GFR: 85.96 mL/min (ref >60.00)
Glucose, Bld: 113 mg/dL — ABNORMAL HIGH (ref 70–99)
Potassium: 4.4 mEq/L (ref 3.5–5.1)
Sodium: 137 mEq/L (ref 135–145)
Total Bilirubin: 0.8 mg/dL (ref 0.2–1.2)
Total Protein: 8.3 g/dL (ref 6.0–8.3)

## 2020-09-02 LAB — LIPID PANEL
Cholesterol: 148 mg/dL (ref 0–200)
HDL: 57.7 mg/dL (ref >39.00)
LDL Cholesterol: 74 mg/dL (ref 0–99)
NonHDL: 90.2
Total CHOL/HDL Ratio: 3
Triglycerides: 79 mg/dL (ref 0.0–149.0)
VLDL: 15.8 mg/dL (ref 0.0–40.0)

## 2020-09-02 LAB — HEMOGLOBIN A1C: Hgb A1c MFr Bld: 5.7 % (ref 4.6–6.5)

## 2020-09-02 MED ORDER — PROPRANOLOL HCL 80 MG PO TABS: 80.0000 mg | ORAL_TABLET | Freq: Two times a day (BID) | ORAL | 1 refills | Status: DC

## 2020-09-02 MED ORDER — LISINOPRIL-HYDROCHLOROTHIAZIDE 20-12.5 MG PO TABS: 1.0000 | ORAL_TABLET | Freq: Every day | ORAL | 2 refills | Status: DC

## 2020-09-02 MED ORDER — ATORVASTATIN CALCIUM 10 MG PO TABS: 10.0000 mg | ORAL_TABLET | Freq: Every day | ORAL | 3 refills | Status: DC

## 2020-09-02 MED ORDER — NICOTINE POLACRILEX 2 MG MT GUM: 2.0000 mg | CHEWING_GUM | OROMUCOSAL | 0 refills | Status: DC | PRN

## 2020-09-02 MED ORDER — AMLODIPINE BESYLATE 5 MG PO TABS: 1.0000 | ORAL_TABLET | Freq: Every day | ORAL | 2 refills | Status: DC

## 2020-09-02 MED ORDER — NICOTINE 21 MG/24HR TD PT24: 21.0000 mg | MEDICATED_PATCH | Freq: Every day | TRANSDERMAL | 0 refills | Status: DC

## 2020-09-02 NOTE — Assessment & Plan Note (Signed)
BP at goal. Cont lisinopril-hctz 20-12.5, propranolol 80 mg bid, and amlodipine 5 mg

## 2020-09-02 NOTE — Assessment & Plan Note (Signed)
Will check labs today. Cont atorvastatin 10 mg.

## 2020-09-02 NOTE — Progress Notes (Signed)
Subjective:     John Peters is a 50 y.o. male presenting for Medication Refill     HPI   #Tobacco use - has information for scheduling lung cancer screening - working to cut back - not a full pack per day  #broke foot - was out of work for 3 months -  #HTN - propranolol and lisinopril  - bp well controlled  Review of Systems   Social History   Tobacco Use  Smoking Status Current Every Day Smoker  . Packs/day: 1.00  . Years: 25.00  . Pack years: 25.00  . Start date: 1995  Smokeless Tobacco Never Used        Objective:    BP Readings from Last 3 Encounters:  09/02/20 118/78  10/30/19 110/80  09/18/19 (!) 168/108   Wt Readings from Last 3 Encounters:  09/02/20 151 lb (68.5 kg)  10/30/19 151 lb 4 oz (68.6 kg)  09/18/19 158 lb (71.7 kg)    BP 118/78   Pulse (!) 52   Temp 97.7 F (36.5 C) (Oral)   Ht 5\' 11"  (1.803 m)   Wt 151 lb (68.5 kg)   SpO2 97%   BMI 21.06 kg/m    Physical Exam Constitutional:      Appearance: Normal appearance. He is not ill-appearing or diaphoretic.  HENT:     Right Ear: External ear normal.     Left Ear: External ear normal.     Nose: Nose normal.  Eyes:     General: No scleral icterus.    Extraocular Movements: Extraocular movements intact.     Conjunctiva/sclera: Conjunctivae normal.  Cardiovascular:     Rate and Rhythm: Normal rate and regular rhythm.     Heart sounds: No murmur heard.   Pulmonary:     Effort: Pulmonary effort is normal. No respiratory distress.     Breath sounds: Normal breath sounds. No wheezing.  Musculoskeletal:     Cervical back: Neck supple.  Skin:    General: Skin is warm and dry.  Neurological:     Mental Status: He is alert. Mental status is at baseline.  Psychiatric:        Mood and Affect: Mood normal.           Assessment & Plan:   Problem List Items Addressed This Visit      Cardiovascular and Mediastinum   HTN (hypertension)    BP at goal. Cont  lisinopril-hctz 20-12.5, propranolol 80 mg bid, and amlodipine 5 mg      Relevant Medications   atorvastatin (LIPITOR) 10 MG tablet   propranolol (INDERAL) 80 MG tablet   lisinopril-hydrochlorothiazide (ZESTORETIC) 20-12.5 MG tablet   amLODipine (NORVASC) 5 MG tablet     Other   Tobacco abuse - Primary    Interested in quitting. Discussed trial of patches (he will contact if not too expensive for next steps). If too expensive quit line provided for resources. He has the number for lung cancer screening and is planning to schedule in a few weeks.       Relevant Medications   nicotine (NICOTINE STEP 1) 21 mg/24hr patch   nicotine polacrilex (NICORETTE) 2 MG gum   HLD (hyperlipidemia)    Will check labs today. Cont atorvastatin 10 mg.       Relevant Medications   atorvastatin (LIPITOR) 10 MG tablet   propranolol (INDERAL) 80 MG tablet   lisinopril-hydrochlorothiazide (ZESTORETIC) 20-12.5 MG tablet   amLODipine (NORVASC) 5 MG tablet  Other Relevant Orders   Lipid panel   Elevated glucose    Noted on 2 prior labs. Will check hgb a1c, suspect prediabetes.       Relevant Orders   Hemoglobin A1c    Other Visit Diagnoses    Colon cancer screening       Relevant Orders   Fecal occult blood, imunochemical   Essential hypertension       Relevant Medications   atorvastatin (LIPITOR) 10 MG tablet   propranolol (INDERAL) 80 MG tablet   lisinopril-hydrochlorothiazide (ZESTORETIC) 20-12.5 MG tablet   amLODipine (NORVASC) 5 MG tablet   Other Relevant Orders   Comprehensive metabolic panel   Need for Tdap vaccination       Relevant Orders   Tdap vaccine greater than or equal to 7yo IM (Completed)       Return in about 1 year (around 09/02/2021) for for annual exam.  Lesleigh Noe, MD  This visit occurred during the SARS-CoV-2 public health emergency.  Safety protocols were in place, including screening questions prior to the visit, additional usage of staff PPE, and extensive  cleaning of exam room while observing appropriate contact time as indicated for disinfecting solutions.

## 2020-09-02 NOTE — Assessment & Plan Note (Signed)
Interested in quitting. Discussed trial of patches (he will contact if not too expensive for next steps). If too expensive quit line provided for resources. He has the number for lung cancer screening and is planning to schedule in a few weeks.

## 2020-09-02 NOTE — Assessment & Plan Note (Signed)
Noted on 2 prior labs. Will check hgb a1c, suspect prediabetes.

## 2020-09-02 NOTE — Patient Instructions (Addendum)
#  Tobacco - check with cost on nicotine patches - if it works for you call the clinic and I will send in Step 2 and Step 3 the lower doses of patches  Can also try the [place] Quit-line   [employer]  1-800-QUIT-NOW 406-622-6178)  Return in 1 year for annual visit  Check with insurance on Shingles Vaccine Coverage

## 2020-10-06 ENCOUNTER — Other Ambulatory Visit (INDEPENDENT_AMBULATORY_CARE_PROVIDER_SITE_OTHER): Payer: BLUE CROSS/BLUE SHIELD

## 2020-10-06 ENCOUNTER — Telehealth: Payer: Self-pay | Admitting: Radiology

## 2020-10-06 DIAGNOSIS — Z1211 Encounter for screening for malignant neoplasm of colon: Secondary | ICD-10-CM

## 2020-10-06 LAB — FECAL OCCULT BLOOD, IMMUNOCHEMICAL: Fecal Occult Bld: POSITIVE — AB

## 2020-10-06 NOTE — Telephone Encounter (Signed)
Noted, see result note  

## 2020-10-06 NOTE — Telephone Encounter (Signed)
[facility] called a POSITIVE ifob, results given to Allie Bossier.

## 2020-10-12 ENCOUNTER — Telehealth: Payer: Self-pay | Admitting: Family Medicine

## 2020-10-12 DIAGNOSIS — R195 Other fecal abnormalities: Secondary | ICD-10-CM

## 2020-10-12 NOTE — Telephone Encounter (Signed)
Spoke to pt and notified him of GI referral. Pt states he will wait for their call and then schedule for near future once he pays off some other bills.

## 2020-10-12 NOTE — Telephone Encounter (Signed)
Please call patient and advise that referral to GI was placed. Agree with Allie Bossier on recommendation for colonoscopy given abnormal colon cancer screening.

## 2020-10-21 ENCOUNTER — Other Ambulatory Visit: Payer: Self-pay

## 2020-10-21 MED ORDER — NA SULFATE-K SULFATE-MG SULF 17.5-3.13-1.6 GM/177ML PO SOLN: 1.0000 | Freq: Once | ORAL | 0 refills | Status: AC

## 2020-10-21 NOTE — Progress Notes (Signed)
Procedure and bowel prep instructions have been sent via my chart and mailed out. Bowel prep sent to pharmacy.

## 2020-12-09 ENCOUNTER — Other Ambulatory Visit: Payer: Self-pay | Admitting: Family Medicine

## 2020-12-09 DIAGNOSIS — I10 Essential (primary) hypertension: Secondary | ICD-10-CM

## 2021-02-03 ENCOUNTER — Ambulatory Visit: Payer: BLUE CROSS/BLUE SHIELD | Admitting: Anesthesiology

## 2021-02-03 ENCOUNTER — Other Ambulatory Visit: Payer: Self-pay

## 2021-02-03 ENCOUNTER — Encounter
Admission: RE | Disposition: A | Discharge status: Home / Self Care | Payer: Self-pay | Source: Ambulatory Visit | Attending: Gastroenterology

## 2021-02-03 ENCOUNTER — Ambulatory Visit
Admission: RE | Admit: 2021-02-03 | Discharge: 2021-02-03 | Disposition: A | Discharge status: Home / Self Care | Payer: BLUE CROSS/BLUE SHIELD | Source: Ambulatory Visit | Attending: Gastroenterology | Admitting: Gastroenterology

## 2021-02-03 ENCOUNTER — Encounter: Payer: Self-pay | Admitting: Gastroenterology

## 2021-02-03 DIAGNOSIS — F17210 Nicotine dependence, cigarettes, uncomplicated: Secondary | ICD-10-CM | POA: Insufficient documentation

## 2021-02-03 DIAGNOSIS — D120 Benign neoplasm of cecum: Secondary | ICD-10-CM | POA: Diagnosis not present

## 2021-02-03 DIAGNOSIS — D122 Benign neoplasm of ascending colon: Secondary | ICD-10-CM | POA: Diagnosis not present

## 2021-02-03 DIAGNOSIS — F419 Anxiety disorder, unspecified: Secondary | ICD-10-CM | POA: Diagnosis not present

## 2021-02-03 DIAGNOSIS — Z8349 Family history of other endocrine, nutritional and metabolic diseases: Secondary | ICD-10-CM | POA: Diagnosis not present

## 2021-02-03 DIAGNOSIS — Z8269 Family history of other diseases of the musculoskeletal system and connective tissue: Secondary | ICD-10-CM | POA: Diagnosis not present

## 2021-02-03 DIAGNOSIS — Z1211 Encounter for screening for malignant neoplasm of colon: Secondary | ICD-10-CM | POA: Diagnosis not present

## 2021-02-03 DIAGNOSIS — Z8371 Family history of colonic polyps: Secondary | ICD-10-CM | POA: Diagnosis not present

## 2021-02-03 DIAGNOSIS — Z800 Family history of malignant neoplasm of digestive organs: Secondary | ICD-10-CM | POA: Insufficient documentation

## 2021-02-03 DIAGNOSIS — Z79899 Other long term (current) drug therapy: Secondary | ICD-10-CM | POA: Diagnosis not present

## 2021-02-03 DIAGNOSIS — D125 Benign neoplasm of sigmoid colon: Secondary | ICD-10-CM | POA: Diagnosis not present

## 2021-02-03 DIAGNOSIS — Z8249 Family history of ischemic heart disease and other diseases of the circulatory system: Secondary | ICD-10-CM | POA: Insufficient documentation

## 2021-02-03 DIAGNOSIS — K635 Polyp of colon: Secondary | ICD-10-CM | POA: Diagnosis not present

## 2021-02-03 DIAGNOSIS — Z803 Family history of malignant neoplasm of breast: Secondary | ICD-10-CM | POA: Insufficient documentation

## 2021-02-03 DIAGNOSIS — D126 Benign neoplasm of colon, unspecified: Secondary | ICD-10-CM

## 2021-02-03 HISTORY — PX: COLONOSCOPY: SHX5424

## 2021-02-03 SURGERY — COLONOSCOPY
Anesthesia: General

## 2021-02-03 MED ORDER — PROPOFOL 10 MG/ML IV BOLUS
INTRAVENOUS | Status: AC
  Filled 2021-02-03: qty 20

## 2021-02-03 MED ORDER — PROPOFOL 500 MG/50ML IV EMUL
INTRAVENOUS | Status: DC | PRN
  Administered 2021-02-03: 120 ug/kg/min via INTRAVENOUS

## 2021-02-03 MED ORDER — SODIUM CHLORIDE (PF) 0.9 % IJ SOLN
INTRAMUSCULAR | Status: DC | PRN
  Administered 2021-02-03: 7 mL

## 2021-02-03 MED ORDER — SODIUM CHLORIDE 0.9 % IV SOLN: INTRAVENOUS | Status: DC

## 2021-02-03 MED ORDER — PROPOFOL 500 MG/50ML IV EMUL
INTRAVENOUS | Status: AC
  Filled 2021-02-03: qty 50

## 2021-02-03 NOTE — Anesthesia Preprocedure Evaluation (Signed)
Anesthesia Evaluation  Patient identified by MRN, date of birth, ID band Patient awake    Airway Mallampati: II       Dental  (+) Teeth Intact   Pulmonary neg pulmonary ROS, Current Smoker,     + decreased breath sounds      Cardiovascular Exercise Tolerance: Good hypertension, Pt. on medications  Rhythm:Regular Rate:Normal     Neuro/Psych Anxiety negative neurological ROS     GI/Hepatic negative GI ROS, Neg liver ROS,   Endo/Other  negative endocrine ROS  Renal/GU negative Renal ROS     Musculoskeletal negative musculoskeletal ROS (+)   Abdominal Normal abdominal exam  (+)   Peds negative pediatric ROS (+)  Hematology negative hematology ROS (+)   Anesthesia Other Findings   Reproductive/Obstetrics negative OB ROS                             Anesthesia Physical Anesthesia Plan  ASA: 3  Anesthesia Plan: General   Post-op Pain Management:    Induction: Intravenous  PONV Risk Score and Plan:   Airway Management Planned: Nasal Cannula  Additional Equipment:   Intra-op Plan:   Post-operative Plan:   Informed Consent: I have reviewed the patients History and Physical, chart, labs and discussed the procedure including the risks, benefits and alternatives for the proposed anesthesia with the patient or authorized representative who has indicated his/her understanding and acceptance.     Dental Advisory Given  Plan Discussed with: CRNA and Surgeon  Anesthesia Plan Comments: (Patient consented for risks of anesthesia including but not limited to:  - adverse reactions to medications - risk of airway placement if required - damage to eyes, teeth, lips or other oral mucosa - nerve damage due to positioning  - sore throat or hoarseness - Damage to heart, brain, nerves, lungs, other parts of body or loss of life  Patient voiced understanding.)        Anesthesia Quick  Evaluation

## 2021-02-03 NOTE — Op Note (Signed)
[facility] Patient Name: John Peters Procedure Date: 02/03/2021 10:20 AM MRN: [id number] Account #: [id number] Date of Birth: 1970/07/14 Admit Type: Outpatient Age: 50 Room: [facility] ENDO ROOM 3 Gender: Male Note Status: Finalized Instrument Name: Jasper Riling [id number] Procedure:             Colonoscopy Indications:           Screening for colorectal malignant neoplasm Providers:             [name] MD, MD Referring MD:          Jobe Marker. Einar Pheasant (Referring MD) Medicines:             Monitored Anesthesia Care Complications:         No immediate complications. Procedure:             Pre-Anesthesia Assessment:                        - Prior to the procedure, a History and Physical was                         performed, and patient medications, allergies and                         sensitivities were reviewed. The patient's tolerance                         of previous anesthesia was reviewed.                        - The risks and benefits of the procedure and the                         sedation options and risks were discussed with the                         patient. All questions were answered and informed                         consent was obtained.                        - ASA Grade Assessment: II - A patient with mild                         systemic disease.                        After obtaining informed consent, the colonoscope was                         passed under direct vision. Throughout the procedure,                         the patient's blood pressure, pulse, and oxygen                         saturations were monitored continuously. The                         Colonoscope was introduced  through the anus and                         advanced to the the cecum, identified by the                         appendiceal orifice. The colonoscopy was performed                         with ease. The patient tolerated the procedure well.                          The quality of the bowel preparation was good. Findings:      The perianal and digital rectal examinations were normal.      A 10 mm polyp was found in the sigmoid colon. The polyp was sessile. The       polyp was removed with a piecemeal technique using a cold snare.       Resection and retrieval were complete.      A 7 mm polyp was found in the ascending colon. The polyp was sessile.       The polyp was removed with a cold snare. Resection and retrieval were       complete.      A 10 mm polyp was found in the cecum. The polyp was sessile. The polyp       was removed with a piecemeal technique using a cold snare. Resection and       retrieval were complete. To prevent bleeding after the polypectomy, one       hemostatic clip was successfully placed. There was no bleeding during,       or at the end, of the procedure.      A 20 mm polyp was found in the ascending colon. The polyp was sessile.       Preparations were made for mucosal resection. Saline was injected to       raise the lesion. Piecemeal mucosal resection using a snare was       performed. Resection was complete, and retrieval was complete. To       prevent bleeding after the polypectomy, five hemostatic clips were       successfully placed. There was no bleeding during, or at the end, of the       procedure.      The exam was otherwise without abnormality. Impression:            - One 10 mm polyp in the sigmoid colon, removed                         piecemeal using a cold snare. Resected and retrieved.                        - One 7 mm polyp in the ascending colon, removed with                         a cold snare. Resected and retrieved.                        - One 10 mm polyp in the cecum, removed piecemeal  using a cold snare. Resected and retrieved. Clip was                         placed.                        - One 20 mm polyp in the ascending colon, removed with                          mucosal resection. Resected and retrieved. Clips were                         placed.                        - The examination was otherwise normal.                        - Mucosal resection was performed. Resection was                         complete, and retrieval was complete. Recommendation:        - Discharge patient to home (with escort).                        - Resume previous diet.                        - Continue present medications.                        - Await pathology results.                        - Repeat colonoscopy in 6 months for surveillance                         after piecemeal polypectomy. Procedure Code(s):     --- Professional ---                        [phone], Colonoscopy, flexible; with endoscopic mucosal                         resection                        45385, 30, Colonoscopy, flexible; with removal of                         tumor(s), polyp(s), or other lesion(s) by snare                         technique Diagnosis Code(s):     --- Professional ---                        Z12.11, Encounter for screening for malignant neoplasm                         of colon  K63.5, Polyp of colon CPT copyright 2019 American Medical Association. All rights reserved. The codes documented in this report are preliminary and upon coder review may  be revised to meet current compliance requirements. [name], MD [name] MD, MD 02/03/2021 11:25:22 AM This report has been signed electronically. Number of Addenda: 0 Note Initiated On: 02/03/2021 10:20 AM Scope Withdrawal Time: 0 hours 41 minutes 6 seconds  Total Procedure Duration: 0 hours 43 minutes 12 seconds  Estimated Blood Loss:  Estimated blood loss: none.      [facility]

## 2021-02-03 NOTE — H&P (Signed)
John Bellows, MD [address] [place], [place], [place], [address] [address], [place], [place], [place], [address] Phone: [phone]  Fax: [phone]  Primary Care Physician:  John Noe, MD   Pre-Procedure History & Physical: HPI:  John Peters is a 50 y.o. male is here for an colonoscopy.   Past Medical History:  Diagnosis Date   Anxiety    Hyperlipidemia    Hypertension     Past Surgical History:  Procedure Laterality Date   renal ultrasound  08/1998   normal    Prior to Admission medications   Medication Sig Start Date End Date Taking? Authorizing Provider  amLODipine (NORVASC) 5 MG tablet Take 1 tablet (5 mg total) by mouth daily. 09/02/20  Yes John Noe, MD  aspirin EC 81 MG tablet Take 81 mg by mouth daily.   Yes [provider]  atorvastatin (LIPITOR) 10 MG tablet Take 1 tablet (10 mg total) by mouth daily. 09/02/20  Yes John Noe, MD  lisinopril-hydrochlorothiazide (ZESTORETIC) 20-12.5 MG tablet Take 1 tablet by mouth daily. 09/02/20  Yes John Noe, MD  Omega-3 Fatty Acids (FISH OIL) 1000 MG CAPS Take one by mouth daily   Yes [provider]  propranolol (INDERAL) 80 MG tablet Take 1 tablet (80 mg total) by mouth 2 (two) times daily. 09/02/20  Yes John Noe, MD  vitamin C (ASCORBIC ACID) 500 MG tablet Take 500 mg by mouth daily.   Yes [provider]  nicotine (NICOTINE STEP 1) 21 mg/24hr patch Place 1 patch (21 mg total) onto the skin daily. 09/02/20   John Noe, MD  nicotine polacrilex (NICORETTE) 2 MG gum Take 1 each (2 mg total) by mouth as needed for smoking cessation. 09/02/20   John Noe, MD    Allergies as of 10/21/2020   (No Known Allergies)    Family History  Problem Relation Age of Onset   Colon polyps Father    Hypertension Mother    Hyperlipidemia Mother    Fibromyalgia Mother    Breast cancer Paternal Aunt    Liver cancer Maternal Grandmother     Social History    Socioeconomic History   Marital status: Single    Spouse name: Not on file   Number of children: 2   Years of education: Trade school   Highest education level: Not on file  Occupational History   Occupation: IT sales professional: [employer]  Tobacco Use   Smoking status: Every Day    Packs/day: 1.00    Years: 25.00    Pack years: 25.00    Types: Cigarettes    Start date: 1995   Smokeless tobacco: Never  Vaping Use   Vaping Use: Never used  Substance and Sexual Activity   Alcohol use: Yes    Comment: 6 beers weekdays, more on the weekend   Drug use: Yes    Frequency: 7.0 times per week    Types: Marijuana   Sexual activity: Yes    Partners: Female    Birth control/protection: None  Other Topics Concern   Not on file  Social History Narrative   10/30/19   From: the area   Living: alone   Work: delivery driver - for [employer]      Family: 2 adult daughters - Lovena Le and Andi Hence - one grandchild      Enjoys: Haematologist, chores - lives on a lot [place] land  Exercise: not lately, 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   Diet: tries to eat healthy - does like some high salt meat but tries to limit      Safety   Seat belts: Yes    Guns: Yes  and secure   Safe in relationships: Yes    Social Determinants of Health   Financial Resource Strain: Not on file  Food Insecurity: Not on file  Transportation Needs: Not on file  Physical Activity: Not on file  Stress: Not on file  Social Connections: Not on file  Intimate Partner Violence: Not on file    Review of Systems: See HPI, otherwise negative ROS  Physical Exam: BP (!) 142/106   Pulse 74   Temp (!) 96.8 F (36 C) (Temporal)   Resp 16   Ht 5\' 11"  (1.803 m)   Wt 72.6 kg   SpO2 100%   BMI 22.32 kg/m  General:   Alert,  pleasant and cooperative in NAD Head:  Normocephalic and atraumatic. Neck:  Supple; no masses or thyromegaly. Lungs:  Clear throughout to auscultation, normal  respiratory effort.    Heart:  +S1, +S2, Regular rate and rhythm, No edema. Abdomen:  Soft, nontender and nondistended. Normal bowel sounds, without guarding, and without rebound.   Neurologic:  Alert and  oriented x4;  grossly normal neurologically.  Impression/Plan: John Peters is here for an colonoscopy to be performed for Screening colonoscopy average risk   Risks, benefits, limitations, and alternatives regarding  colonoscopy have been reviewed with the patient.  Questions have been answered.  All parties agreeable.   John Bellows, MD  02/03/2021, 10:19 AM \

## 2021-02-03 NOTE — Anesthesia Postprocedure Evaluation (Signed)
Anesthesia Post Note  Patient: John Peters  Procedure(s) Performed: COLONOSCOPY  Patient location during evaluation: PACU Anesthesia Type: General Level of consciousness: awake and oriented Pain management: pain level controlled Vital Signs Assessment: post-procedure vital signs reviewed and stable Respiratory status: spontaneous breathing and respiratory function stable Cardiovascular status: blood pressure returned to baseline Anesthetic complications: no   No notable events documented.   Last Vitals:  Vitals:   02/03/21 1127 02/03/21 1151  BP:  (!) 146/97  Pulse:    Resp:  16  Temp: (!) 36.1 C   SpO2:      Last Pain:  Vitals:   02/03/21 1127  TempSrc: Temporal  PainSc: 0-No pain                 VAN STAVEREN,[name]

## 2021-02-03 NOTE — Anesthesia Procedure Notes (Signed)
Date/Time: 02/03/2021 10:37 AM Performed by: Vaughan Sine Pre-anesthesia Checklist: Patient identified, Emergency Drugs available, Suction available, Patient being monitored and Timeout performed Patient Re-evaluated:Patient Re-evaluated prior to induction Oxygen Delivery Method: Nasal cannula Preoxygenation: Pre-oxygenation with 100% oxygen Induction Type: IV induction Placement Confirmation: positive ETCO2 and CO2 detector

## 2021-02-03 NOTE — Transfer of Care (Signed)
Immediate Anesthesia Transfer of Care Note  Patient: John Peters  Procedure(s) Performed: COLONOSCOPY  Patient Location: PACU  Anesthesia Type:General  Level of Consciousness: awake and sedated  Airway & Oxygen Therapy: Patient Spontanous Breathing and Patient connected to nasal cannula oxygen  Post-op Assessment: Report given to RN and Post -op Vital signs reviewed and stable  Post vital signs: Reviewed and stable  Last Vitals:  Vitals Value Taken Time  BP    Temp    Pulse    Resp    SpO2      Last Pain:  Vitals:   02/03/21 0934  TempSrc: Temporal  PainSc: 0-No pain         Complications: No notable events documented.

## 2021-02-04 LAB — SURGICAL PATHOLOGY

## 2021-02-21 ENCOUNTER — Encounter: Payer: Self-pay | Admitting: Gastroenterology

## 2021-03-01 ENCOUNTER — Telehealth: Payer: Self-pay

## 2021-03-01 NOTE — Telephone Encounter (Signed)
Patient was contacted and she stated that she had received her letter and understood that we will call her in 6 months.

## 2021-03-01 NOTE — Telephone Encounter (Signed)
-----   Message from [name], MD sent at 02/21/2021  8:13 AM EST ----- Inform polyps were adenomas and sine taken out piecemeal needs to be repeated in 6 months please

## 2021-03-05 ENCOUNTER — Other Ambulatory Visit: Payer: Self-pay | Admitting: Family Medicine

## 2021-03-05 DIAGNOSIS — I10 Essential (primary) hypertension: Secondary | ICD-10-CM

## 2021-03-08 NOTE — Telephone Encounter (Signed)
Last refilled on 09/02/20 # 180, 1 refill  LOV 09/02/20 with Dr Einar Pheasant, no future appointments

## 2021-03-22 ENCOUNTER — Telehealth: Payer: Self-pay | Admitting: Gastroenterology

## 2021-03-22 NOTE — Telephone Encounter (Signed)
Inbound call from Rosanna Randy requesting a call back to go over pt's pathology report. Please advise Thank you.

## 2021-03-22 NOTE — Telephone Encounter (Signed)
Called patient back and he stated that he had been trying to call me but when I called him he stated that he will call me until Thursday since he will be off until then and he had questions to ask. I told him it was okay with me.

## 2021-03-24 NOTE — Telephone Encounter (Signed)
Pt returned phone call needing to schedule his colonoscopy.

## 2021-03-29 NOTE — Telephone Encounter (Signed)
Called patient back and had to leave him a message again to call me back.

## 2021-03-31 ENCOUNTER — Other Ambulatory Visit: Payer: Self-pay

## 2021-03-31 DIAGNOSIS — Z86010 Personal history of colonic polyps: Secondary | ICD-10-CM

## 2021-03-31 MED ORDER — NA SULFATE-K SULFATE-MG SULF 17.5-3.13-1.6 GM/177ML PO SOLN: 354.0000 mL | Freq: Once | ORAL | 0 refills | Status: AC

## 2021-03-31 NOTE — Telephone Encounter (Signed)
Called patient and he stated that he wanted to go ahead and schedule his colonoscopy now since he barely have time to do personal things due to his job schedule. Therefore, he agreed on having his colonoscopy on 08/04/2021.

## 2021-06-02 ENCOUNTER — Other Ambulatory Visit: Payer: Self-pay | Admitting: Family Medicine

## 2021-06-02 DIAGNOSIS — I10 Essential (primary) hypertension: Secondary | ICD-10-CM

## 2021-06-12 ENCOUNTER — Other Ambulatory Visit: Payer: Self-pay | Admitting: Primary Care

## 2021-06-12 DIAGNOSIS — I10 Essential (primary) hypertension: Secondary | ICD-10-CM

## 2021-06-21 ENCOUNTER — Other Ambulatory Visit: Payer: Self-pay | Admitting: Family Medicine

## 2021-06-21 DIAGNOSIS — I10 Essential (primary) hypertension: Secondary | ICD-10-CM

## 2021-07-04 ENCOUNTER — Other Ambulatory Visit: Payer: Self-pay | Admitting: Family Medicine

## 2021-07-04 DIAGNOSIS — I10 Essential (primary) hypertension: Secondary | ICD-10-CM

## 2021-07-07 NOTE — Telephone Encounter (Signed)
Appt scheduled for 09/01/21 ?

## 2021-07-07 NOTE — Telephone Encounter (Signed)
Patient scheduled appt for 09/01/21 ?

## 2021-08-01 ENCOUNTER — Encounter: Payer: Self-pay | Admitting: Gastroenterology

## 2021-08-04 ENCOUNTER — Ambulatory Visit: Payer: BC Managed Care – PPO | Admitting: Anesthesiology

## 2021-08-04 ENCOUNTER — Other Ambulatory Visit: Payer: Self-pay

## 2021-08-04 ENCOUNTER — Encounter: Payer: Self-pay | Admitting: Gastroenterology

## 2021-08-04 ENCOUNTER — Ambulatory Visit
Admission: RE | Admit: 2021-08-04 | Discharge: 2021-08-04 | Disposition: A | Discharge status: Home / Self Care | Payer: BC Managed Care – PPO | Source: Ambulatory Visit | Attending: Gastroenterology | Admitting: Gastroenterology

## 2021-08-04 ENCOUNTER — Encounter
Admission: RE | Disposition: A | Discharge status: Home / Self Care | Payer: Self-pay | Source: Ambulatory Visit | Attending: Gastroenterology

## 2021-08-04 DIAGNOSIS — Z8371 Family history of colonic polyps: Secondary | ICD-10-CM | POA: Diagnosis not present

## 2021-08-04 DIAGNOSIS — F419 Anxiety disorder, unspecified: Secondary | ICD-10-CM | POA: Diagnosis not present

## 2021-08-04 DIAGNOSIS — K635 Polyp of colon: Secondary | ICD-10-CM | POA: Diagnosis not present

## 2021-08-04 DIAGNOSIS — F17210 Nicotine dependence, cigarettes, uncomplicated: Secondary | ICD-10-CM | POA: Insufficient documentation

## 2021-08-04 DIAGNOSIS — Z09 Encounter for follow-up examination after completed treatment for conditions other than malignant neoplasm: Secondary | ICD-10-CM | POA: Diagnosis not present

## 2021-08-04 DIAGNOSIS — Z79899 Other long term (current) drug therapy: Secondary | ICD-10-CM | POA: Insufficient documentation

## 2021-08-04 DIAGNOSIS — Z1211 Encounter for screening for malignant neoplasm of colon: Secondary | ICD-10-CM | POA: Diagnosis not present

## 2021-08-04 DIAGNOSIS — Z86010 Personal history of colonic polyps: Secondary | ICD-10-CM | POA: Insufficient documentation

## 2021-08-04 DIAGNOSIS — K640 First degree hemorrhoids: Secondary | ICD-10-CM | POA: Diagnosis not present

## 2021-08-04 DIAGNOSIS — D124 Benign neoplasm of descending colon: Secondary | ICD-10-CM | POA: Insufficient documentation

## 2021-08-04 DIAGNOSIS — E785 Hyperlipidemia, unspecified: Secondary | ICD-10-CM | POA: Diagnosis not present

## 2021-08-04 DIAGNOSIS — I10 Essential (primary) hypertension: Secondary | ICD-10-CM | POA: Insufficient documentation

## 2021-08-04 HISTORY — PX: COLONOSCOPY WITH PROPOFOL: SHX5780

## 2021-08-04 SURGERY — COLONOSCOPY WITH PROPOFOL
Anesthesia: General

## 2021-08-04 MED ORDER — PROPOFOL 500 MG/50ML IV EMUL
INTRAVENOUS | Status: AC
  Filled 2021-08-04: qty 50

## 2021-08-04 MED ORDER — STERILE WATER FOR IRRIGATION IR SOLN
Status: DC | PRN
  Administered 2021-08-04: 60 mL

## 2021-08-04 MED ORDER — PROPOFOL 500 MG/50ML IV EMUL
INTRAVENOUS | Status: DC | PRN
  Administered 2021-08-04: 150 ug/kg/min via INTRAVENOUS

## 2021-08-04 MED ORDER — SODIUM CHLORIDE 0.9 % IV SOLN: INTRAVENOUS | Status: DC

## 2021-08-04 NOTE — Anesthesia Procedure Notes (Signed)
Procedure Name: [place] ?Date/Time: 08/04/2021 7:48 AM ?Performed by: Vaughan Sine ?Pre-anesthesia Checklist: Patient identified, Emergency Drugs available, Suction available, Patient being monitored and Timeout performed ?Patient Re-evaluated:Patient Re-evaluated prior to induction ?Oxygen Delivery Method: Nasal cannula ?Preoxygenation: Pre-oxygenation with 100% oxygen ?Induction Type: IV induction ? ? ? ? ?

## 2021-08-04 NOTE — Anesthesia Preprocedure Evaluation (Signed)
Anesthesia Evaluation  ?Patient identified by MRN, date of birth, ID band ?Patient awake ? ? ? ?Reviewed: ?Allergy & Precautions, NPO status , Patient's Chart, lab work & pertinent test results ? ?Airway ?Mallampati: II ? ?TM Distance: >3 FB ?Neck ROM: Full ? ? ? Dental ? ?(+) Teeth Intact ?  ?Pulmonary ?neg pulmonary ROS, COPD, Current Smoker,  ?  ?Pulmonary exam normal ? ?+ decreased breath sounds ? ? ? ? ? Cardiovascular ?Exercise Tolerance: Good ?hypertension, Pt. on medications ?negative cardio ROS ?Normal cardiovascular exam ?Rhythm:Regular Rate:Normal ? ? ?  ?Neuro/Psych ?Anxiety negative neurological ROS ? negative psych ROS  ? GI/Hepatic ?negative GI ROS, Neg liver ROS,   ?Endo/Other  ?negative endocrine ROS ? Renal/GU ?negative Renal ROS  ?negative genitourinary ?  ?Musculoskeletal ?negative musculoskeletal ROS ?(+)  ? Abdominal ?  ?Peds ?negative pediatric ROS ?(+)  Hematology ?negative hematology ROS ?(+)   ?Anesthesia Other Findings ?Past Medical History: ?No date: Anxiety ?No date: Hyperlipidemia ?No date: Hypertension ? ?Past Surgical History: ?02/03/2021: COLONOSCOPY; N/A ?    Comment:  Procedure: COLONOSCOPY;  Surgeon: [name], MD;   ?             Location: [facility] ENDOSCOPY;  Service: Gastroenterology;   ?             Laterality: N/A;  Mother called on Tuesday for an  ?             approximate time, and I told her it could change several  ?             times between now and Thursday. ?Mother Shayde Gervacio  ?             [phone] she said if he doesn't call for his time to  ?             call her and she will give it to him. ?08/1998: renal ultrasound ?    Comment:  normal ? ?BMI   ? Body Mass Index: 21.62 kg/m?  ?  ? ? Reproductive/Obstetrics ?negative OB ROS ? ?  ? ? ? ? ? ? ? ? ? ? ? ? ? ?  ?  ? ? ? ? ? ? ? ? ?Anesthesia Physical ?Anesthesia Plan ? ?ASA: 3 ? ?Anesthesia Plan: General  ? ?Post-op Pain Management:   ? ?Induction: Intravenous ? ?PONV Risk Score  and Plan: Propofol infusion and TIVA ? ?Airway Management Planned: Natural Airway and Nasal Cannula ? ?Additional Equipment:  ? ?Intra-op Plan:  ? ?Post-operative Plan:  ? ?Informed Consent: I have reviewed the patients History and Physical, chart, labs and discussed the procedure including the risks, benefits and alternatives for the proposed anesthesia with the patient or authorized representative who has indicated his/her understanding and acceptance.  ? ? ? ?Dental Advisory Given ? ?Plan Discussed with: CRNA and Surgeon ? ?Anesthesia Plan Comments:   ? ? ? ? ? ? ?Anesthesia Quick Evaluation ? ?

## 2021-08-04 NOTE — Transfer of Care (Signed)
Immediate Anesthesia Transfer of Care Note ? ?Patient: John Peters ? ?Procedure(s) Performed: COLONOSCOPY WITH PROPOFOL ? ?Patient Location: PACU ? ?Anesthesia Type:General ? ?Level of Consciousness: awake and sedated ? ?Airway & Oxygen Therapy: Patient Spontanous Breathing and Patient connected to nasal cannula oxygen ? ?Post-op Assessment: Report given to RN and Post -op Vital signs reviewed and stable ? ?Post vital signs: Reviewed and stable ? ?Last Vitals:  ?Vitals Value Taken Time  ?BP 108/83 08/04/21 0816  ?Temp 36.3 ?C 08/04/21 0815  ?Pulse 74 08/04/21 0817  ?Resp 22 08/04/21 0817  ?SpO2 99 % 08/04/21 0817  ?Vitals shown include unvalidated device data. ? ?Last Pain:  ?Vitals:  ? 08/04/21 0815  ?TempSrc: Temporal  ?PainSc: Asleep  ?   ? ?  ? ?Complications: No notable events documented. ?

## 2021-08-04 NOTE — Anesthesia Postprocedure Evaluation (Signed)
Anesthesia Post Note ? ?Patient: HAYS DUNNIGAN ? ?Procedure(s) Performed: COLONOSCOPY WITH PROPOFOL ? ?Patient location during evaluation: PACU ?Anesthesia Type: General ?Level of consciousness: awake and oriented ?Pain management: pain level controlled ?Vital Signs Assessment: post-procedure vital signs reviewed and stable ?Respiratory status: spontaneous breathing and respiratory function stable ?Cardiovascular status: stable ?Anesthetic complications: no ? ? ?No notable events documented. ? ? ?Last Vitals:  ?Vitals:  ? 08/04/21 0703 08/04/21 0815  ?BP: (!) 145/103 108/83  ?Pulse: 70 72  ?Resp: 20 (!) 21  ?Temp: (!) 35.6 ?C (!) 36.3 ?C  ?SpO2: 100% 99%  ?  ?Last Pain:  ?Vitals:  ? 08/04/21 0815  ?TempSrc: Temporal  ?PainSc: Asleep  ? ? ?  ?  ?  ?  ?  ?  ? ?VAN STAVEREN,Corra Kaine ? ? ? ? ?

## 2021-08-04 NOTE — Op Note (Signed)
[facility] ?Gastroenterology ?Patient Name: John Peters ?Procedure Date: 08/04/2021 7:44 AM ?MRN: [id number] ?Account #: [id number] ?Date of Birth: 10-23-1970 ?Admit Type: Outpatient ?Age: 51 ?Room: [facility] ENDO ROOM 3 ?Gender: Male ?Note Status: Finalized ?Instrument Name: Colonoscope [id number] ?Procedure:             Colonoscopy ?Indications:           Surveillance: Piecemeal removal of large sessile  ?                       adenoma last colonoscopy (< 3 yrs) ?Providers:             [name] MD, MD ?Referring MD:          Jobe Marker. Einar Pheasant (Referring MD) ?Medicines:             Monitored Anesthesia Care ?Complications:         No immediate complications. ?Procedure:             Pre-Anesthesia Assessment: ?                       - Prior to the procedure, a History and Physical was  ?                       performed, and patient medications, allergies and  ?                       sensitivities were reviewed. The patient's tolerance  ?                       of previous anesthesia was reviewed. ?                       - The risks and benefits of the procedure and the  ?                       sedation options and risks were discussed with the  ?                       patient. All questions were answered and informed  ?                       consent was obtained. ?                       - ASA Grade Assessment: II - A patient with mild  ?                       systemic disease. ?                       After obtaining informed consent, the colonoscope was  ?                       passed under direct vision. Throughout the procedure,  ?                       the patient's blood pressure, pulse, and oxygen  ?                       saturations  were monitored continuously. The  ?                       Colonoscope was introduced through the anus and  ?                       advanced to the the cecum, identified by the  ?                       appendiceal orifice. The colonoscopy was performed  ?                        with ease. The patient tolerated the procedure well.  ?                       The quality of the bowel preparation was good. ?Findings: ?     The perianal and digital rectal examinations were normal. ?     A 7 mm polyp was found in the cecum. The polyp was sessile. The polyp  ?     was removed with a cold snare. Resection and retrieval were complete. ?     Three sessile polyps were found in the descending colon. The polyps were  ?     6 to 8 mm in size. These polyps were removed with a cold snare.  ?     Resection and retrieval were complete. To prevent bleeding after the  ?     polypectomy, one hemostatic clip was successfully placed. There was no  ?     bleeding at the end of the procedure. ?     A 3 mm polyp was found in the descending colon. The polyp was sessile.  ?     The polyp was removed with a jumbo cold forceps. Resection and retrieval  ?     were complete. ?     Internal hemorrhoids were found during retroflexion. The hemorrhoids  ?     were medium-sized and Grade I (internal hemorrhoids that do not  ?     prolapse). ?     The exam was otherwise without abnormality on direct and retroflexion  ?     views. ?Impression:            - One 7 mm polyp in the cecum, removed with a cold  ?                       snare. Resected and retrieved. ?                       - Three 6 to 8 mm polyps in the descending colon,  ?                       removed with a cold snare. Resected and retrieved.  ?                       Clip was placed. ?                       - One 3 mm polyp in the descending colon, removed with  ?  a jumbo cold forceps. Resected and retrieved. ?                       - Internal hemorrhoids. ?                       - The examination was otherwise normal on direct and  ?                       retroflexion views. ?Recommendation:        - Discharge patient to home (with escort). ?                       - Resume previous diet. ?                       - Continue present  medications. ?                       - Await pathology results. ?                       - Repeat colonoscopy in 1 year for surveillance after  ?                       piecemeal polypectomy. ?Procedure Code(s):     --- Professional --- ?                       [phone], Colonoscopy, flexible; with removal of  ?                       tumor(s), polyp(s), or other lesion(s) by snare  ?                       technique ?                       45380, 59, Colonoscopy, flexible; with biopsy, single  ?                       or multiple ?Diagnosis Code(s):     --- Professional --- ?                       K63.5, Polyp of colon ?                       Z86.010, Personal history of colonic polyps ?                       K64.0, First degree hemorrhoids ?CPT copyright 2019 American Medical Association. All rights reserved. ?The codes documented in this report are preliminary and upon coder review may  ?be revised to meet current compliance requirements. ?[name], MD ?[name] MD, MD ?08/04/2021 8:14:56 AM ?This report has been signed electronically. ?Number of Addenda: 0 ?Note Initiated On: 08/04/2021 7:44 AM ?Scope Withdrawal Time: 0 hours 15 minutes 15 seconds  ?Total Procedure Duration: 0 hours 23 minutes 21 seconds  ?Estimated Blood Loss:  Estimated blood loss: none. ?     [facility] ?

## 2021-08-04 NOTE — H&P (Signed)
?[name], MD ?[address], [place], [place], [place], [address] ?[address] [place], [place], [place], [address] ?Phone: [phone]  ?Fax: [phone] ? ?Primary Care Physician:  Lesleigh Noe, MD ? ? ?Pre-Procedure History & Physical: ?HPI:  John Peters is a 51 y.o. male is here for an colonoscopy. ?  ?Past Medical History:  ?Diagnosis Date  ? Anxiety   ? Hyperlipidemia   ? Hypertension   ? ? ?Past Surgical History:  ?Procedure Laterality Date  ? COLONOSCOPY N/A 02/03/2021  ? Procedure: COLONOSCOPY;  Surgeon: [name], MD;  Location: [facility] ENDOSCOPY;  Service: Gastroenterology;  Laterality: N/A;  Mother called on Tuesday for an approximate time, and I told her it could change several times between now and Thursday. ?Mother Haywood Meinders [phone] she said if he doesn't call for his time to call her and she will give it to him.  ? renal ultrasound  08/1998  ? normal  ? ? ?Prior to Admission medications   ?Medication Sig Start Date End Date Taking? Authorizing Provider  ?amLODipine (NORVASC) 5 MG tablet TAKE 1 TABLET (5 MG TOTAL) BY MOUTH DAILY. 06/04/21  Yes Lesleigh Noe, MD  ?aspirin EC 81 MG tablet Take 81 mg by mouth daily.   Yes [provider]  ?atorvastatin (LIPITOR) 10 MG tablet Take 1 tablet (10 mg total) by mouth daily. 09/02/20  Yes Lesleigh Noe, MD  ?lisinopril-hydrochlorothiazide (ZESTORETIC) 20-12.5 MG tablet TAKE 1 TABLET BY MOUTH EVERY DAY 06/04/21  Yes Lesleigh Noe, MD  ?nicotine (NICOTINE STEP 1) 21 mg/24hr patch Place 1 patch (21 mg total) onto the skin daily. 09/02/20  Yes Lesleigh Noe, MD  ?nicotine polacrilex (NICORETTE) 2 MG gum Take 1 each (2 mg total) by mouth as needed for smoking cessation. 09/02/20  Yes Lesleigh Noe, MD  ?Omega-3 Fatty Acids (FISH OIL) 1000 MG CAPS Take one by mouth daily   Yes [provider]  ?propranolol (INDERAL) 80 MG tablet TAKE 1 TABLET BY MOUTH TWICE A DAY 06/13/21  Yes Lesleigh Noe, MD  ?vitamin C (ASCORBIC  ACID) 500 MG tablet Take 500 mg by mouth daily.   Yes [provider]  ? ? ?Allergies as of 03/31/2021  ? (No Known Allergies)  ? ? ?Family History  ?Problem Relation Age of Onset  ? Colon polyps Father   ? Hypertension Mother   ? Hyperlipidemia Mother   ? Fibromyalgia Mother   ? Breast cancer Paternal Aunt   ? Liver cancer Maternal Grandmother   ? ? ?Social History  ? ?Socioeconomic History  ? Marital status: Single  ?  Spouse name: Not on file  ? Number of children: 2  ? Years of education: Trade school  ? Highest education level: Not on file  ?Occupational History  ? Occupation: Glass blower/designer  ?  Employer: [employer]  ?Tobacco Use  ? Smoking status: Every Day  ?  Packs/day: 1.00  ?  Years: 25.00  ?  Pack years: 25.00  ?  Types: Cigarettes  ?  Start date: 24  ? Smokeless tobacco: Never  ?Vaping Use  ? Vaping Use: Never used  ?Substance and Sexual Activity  ? Alcohol use: Yes  ?  Alcohol/week: 21.0 standard drinks  ?  Types: 21 Cans of beer per week  ? Drug use: Yes  ?  Frequency: 7.0 times per week  ?  Types: Marijuana  ? Sexual activity: Yes  ?  Partners: Female  ?  Birth control/protection: None  ?Other Topics Concern  ? Not on file  ?Social History Narrative  ? 10/30/19  ? From: the area  ? Living: alone  ? Work: Education officer, community - for [employer]  ?   ? Family: 2 adult daughters - Lovena Le and Andi Hence - one grandchild  ?   ? Enjoys: Haematologist, chores - lives [place] a lot [place] land  ?   ? 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 Diet: tries to eat healthy - does like some high salt meat but tries to limit  ?   ? Safety  ? Seat belts: Yes   ? Guns: Yes  and secure  ? Safe in relationships: Yes   ? ?Social Determinants of Health  ? ?Financial Resource Strain: Not on file  ?Food Insecurity: Not on file  ?Transportation Needs: Not on file  ?Physical Activity: Not on file  ?Stress: Not on file  ?Social Connections: Not on file  ?Intimate Partner Violence: Not on file  ? ? ?Review  of Systems: ?See HPI, otherwise negative ROS ? ?Physical Exam: ?BP (!) 145/103   Pulse 70   Temp (!) 96 ?F (35.6 ?C) (Temporal)   Resp 20   Ht '5\' 11"'$  (1.803 m)   Wt 70.3 kg   SpO2 100%   BMI 21.62 kg/m?  ?General:   Alert,  pleasant and cooperative in NAD ?Head:  Normocephalic and atraumatic. ?Neck:  Supple; no masses or thyromegaly. ?Lungs:  Clear throughout to auscultation, normal respiratory effort.    ?Heart:  +S1, +S2, Regular rate and rhythm, No edema. ?Abdomen:  Soft, nontender and nondistended. Normal bowel sounds, without guarding, and without rebound.   ?Neurologic:  Alert and  oriented x4;  grossly normal neurologically. ? ?Impression/Plan: ?John Peters is here for an colonoscopy to be performed for surveillance due to prior history of colon polyps  ? ?Risks, benefits, limitations, and alternatives regarding  colonoscopy have been reviewed with the patient.  Questions have been answered.  All parties agreeable. ? ? ?[name], MD  08/04/2021, 7:41 AM ? ?

## 2021-08-05 ENCOUNTER — Encounter: Payer: Self-pay | Admitting: Gastroenterology

## 2021-08-05 LAB — SURGICAL PATHOLOGY

## 2021-08-27 ENCOUNTER — Other Ambulatory Visit: Payer: Self-pay | Admitting: Family Medicine

## 2021-08-27 DIAGNOSIS — I10 Essential (primary) hypertension: Secondary | ICD-10-CM

## 2021-09-01 ENCOUNTER — Ambulatory Visit (INDEPENDENT_AMBULATORY_CARE_PROVIDER_SITE_OTHER): Payer: BC Managed Care – PPO | Admitting: Family Medicine

## 2021-09-01 VITALS — BP 120/80 | HR 53 | Temp 97.5°F | Ht 70.5 in | Wt 163.2 lb

## 2021-09-01 DIAGNOSIS — R7303 Prediabetes: Secondary | ICD-10-CM

## 2021-09-01 DIAGNOSIS — I10 Essential (primary) hypertension: Secondary | ICD-10-CM

## 2021-09-01 DIAGNOSIS — E782 Mixed hyperlipidemia: Secondary | ICD-10-CM | POA: Diagnosis not present

## 2021-09-01 DIAGNOSIS — Z23 Encounter for immunization: Secondary | ICD-10-CM

## 2021-09-01 DIAGNOSIS — Z720 Tobacco use: Secondary | ICD-10-CM

## 2021-09-01 DIAGNOSIS — Z0000 Encounter for general adult medical examination without abnormal findings: Secondary | ICD-10-CM | POA: Diagnosis not present

## 2021-09-01 LAB — LIPID PANEL
Cholesterol: 159 mg/dL (ref 0–200)
HDL: 57.5 mg/dL (ref >39.00)
LDL Cholesterol: 79 mg/dL (ref 0–99)
NonHDL: 101.51
Total CHOL/HDL Ratio: 3
Triglycerides: 112 mg/dL (ref 0.0–149.0)
VLDL: 22.4 mg/dL (ref 0.0–40.0)

## 2021-09-01 LAB — COMPREHENSIVE METABOLIC PANEL
ALT: 16 U/L (ref 0–53)
AST: 23 U/L (ref 0–37)
Albumin: 4.7 g/dL (ref 3.5–5.2)
Alkaline Phosphatase: 68 U/L (ref 39–117)
BUN: 12 mg/dL (ref 6–23)
CO2: 29 mEq/L (ref 19–32)
Calcium: 9.4 mg/dL (ref 8.4–10.5)
Chloride: 99 mEq/L (ref 96–112)
Creatinine, Ser: 0.91 mg/dL (ref 0.40–1.50)
GFR: 96.74 mL/min (ref >60.00)
Glucose, Bld: 98 mg/dL (ref 70–99)
Potassium: 4.2 mEq/L (ref 3.5–5.1)
Sodium: 136 mEq/L (ref 135–145)
Total Bilirubin: 0.5 mg/dL (ref 0.2–1.2)
Total Protein: 7.9 g/dL (ref 6.0–8.3)

## 2021-09-01 LAB — HEMOGLOBIN A1C: Hgb A1c MFr Bld: 5.7 % (ref 4.6–6.5)

## 2021-09-01 MED ORDER — LISINOPRIL-HYDROCHLOROTHIAZIDE 20-12.5 MG PO TABS: 1.0000 | ORAL_TABLET | Freq: Every day | ORAL | 3 refills | Status: DC

## 2021-09-01 MED ORDER — ATORVASTATIN CALCIUM 10 MG PO TABS: 10.0000 mg | ORAL_TABLET | Freq: Every day | ORAL | 3 refills | Status: DC

## 2021-09-01 MED ORDER — NICOTINE 21 MG/24HR TD PT24: 21.0000 mg | MEDICATED_PATCH | Freq: Every day | TRANSDERMAL | 0 refills | Status: DC

## 2021-09-01 MED ORDER — NICOTINE POLACRILEX 2 MG MT GUM: 2.0000 mg | CHEWING_GUM | OROMUCOSAL | 0 refills | Status: DC | PRN

## 2021-09-01 MED ORDER — AMLODIPINE BESYLATE 5 MG PO TABS: 5.0000 mg | ORAL_TABLET | Freq: Every day | ORAL | 3 refills | Status: DC

## 2021-09-01 MED ORDER — PROPRANOLOL HCL 80 MG PO TABS: 80.0000 mg | ORAL_TABLET | Freq: Two times a day (BID) | ORAL | 3 refills | Status: DC

## 2021-09-01 NOTE — Progress Notes (Signed)
Annual Exam   Chief Complaint:  Chief Complaint  Patient presents with   Annual Exam    No concerns    History of Present Illness:  John Peters is a 51 y.o. presents today for annual examination.     Nutrition/Lifestyle Diet: regular food, tires to healthy Exercise: house work, and delivery driver He is single partner, contraception - none.  Any issues with getting or keeping erection? Yes - on viagra  Social History   Tobacco Use  Smoking Status Every Day   Packs/day: 1.00   Years: 25.00   Pack years: 25.00   Types: Cigarettes   Start date: 1995  Smokeless Tobacco Never   Social History   Substance and Sexual Activity  Alcohol Use Yes   Alcohol/week: 21.0 standard drinks   Types: 21 Cans of beer per week   Social History   Substance and Sexual Activity  Drug Use Yes   Frequency: 7.0 times per week   Types: Marijuana     Safety The patient wears seatbelts: yes.     The patient feels safe at home and in their relationships: yes.  General Health Dentist in the last year: Yes Eye doctor: no  Weight Wt Readings from Last 3 Encounters:  09/01/21 163 lb 4 oz (74 kg)  08/04/21 155 lb (70.3 kg)  02/03/21 160 lb (72.6 kg)   Patient has normal BMI  BMI Readings from Last 1 Encounters:  09/01/21 23.09 kg/m     Chronic disease screening Blood pressure monitoring:  BP Readings from Last 3 Encounters:  09/01/21 120/80  08/04/21 (!) 123/96  02/03/21 (!) 146/97    Lipid Monitoring: Indication for screening: age >35, obesity, diabetes, family hx, CV risk factors.  Lipid screening: Yes  Lab Results  Component Value Date   CHOL 148 09/02/2020   HDL 57.70 09/02/2020   LDLCALC 74 09/02/2020   LDLDIRECT 139.5 04/18/2013   TRIG 79.0 09/02/2020   CHOLHDL 3 09/02/2020     Diabetes Screening: age >42, overweight, family hx, PCOS, hx of gestational diabetes, at risk ethnicity, elevated blood pressure >135/80.  Diabetes Screening screening: Yes  Lab  Results  Component Value Date   HGBA1C 5.7 09/02/2020        Colon Cancer Screening:  Age 38-75 yo - benefits outweigh the risk. Adults 54-85 yo who have never been screened benefit.  Benefits: 134000 people in 2016 will be diagnosed and 49,000 will die - early detection helps [place]: Complications 2/2 to colonoscopy High Risk (Colonoscopy): genetic disorder (Lynch syndrome or familial adenomatous polyposis), personal hx of IBD, previous adenomatous polyp, or previous colorectal cancer, FamHx start 10 years before the age at diagnosis, increased in males and black race  Options:  FIT - looks for hemoglobin (blood in the stool) - specific and fairly sensitive - must be done annually Cologuard - looks for DNA and blood - more sensitive - therefore can have more false positives, every 3 years Colonoscopy - every 10 years if normal - sedation, bowl prep, must have someone drive you  Shared decision making and the patient had decided to do colonoscopy - had to have 2 done and is due next year.   Social History   Tobacco Use  Smoking Status Every Day   Packs/day: 1.00   Years: 25.00   Pack years: 25.00   Types: Cigarettes   Start date: 1995  Smokeless Tobacco Never    Lung Cancer Screening (Ages 62-80): yes 20 year pack history? Yes  Current Tobacco user? Yes Quit less than 15 years ago? No Interested in low dose CT for lung cancer screening? yes      Past Medical History:  Diagnosis Date   Anxiety    Hyperlipidemia    Hypertension     Past Surgical History:  Procedure Laterality Date   COLONOSCOPY N/A 02/03/2021   Procedure: COLONOSCOPY;  Surgeon: [name], MD;  Location: [facility] ENDOSCOPY;  Service: Gastroenterology;  Laterality: N/A;  Mother called on Tuesday for an approximate time, and I told her it could change several times between now and Thursday. Mother John Peters [phone] she said if he doesn't call for his time to call her and she will give it to  him.   COLONOSCOPY WITH PROPOFOL N/A 08/04/2021   Procedure: COLONOSCOPY WITH PROPOFOL;  Surgeon: [name], MD;  Location[facility] ENDOSCOPY;  Service: Gastroenterology;  Laterality: N/A;   renal ultrasound  08/1998   normal    Prior to Admission medications   Medication Sig Start Date End Date Taking? Authorizing Provider  amLODipine (NORVASC) 5 MG tablet TAKE 1 TABLET (5 MG TOTAL) BY MOUTH DAILY. 06/04/21  Yes Lesleigh Noe, MD  aspirin EC 81 MG tablet Take 81 mg by mouth daily.   Yes [provider]  atorvastatin (LIPITOR) 10 MG tablet Take 1 tablet (10 mg total) by mouth daily. 09/02/20  Yes Lesleigh Noe, MD  lisinopril-hydrochlorothiazide (ZESTORETIC) 20-12.5 MG tablet TAKE 1 TABLET BY MOUTH EVERY DAY 06/04/21  Yes Lesleigh Noe, MD  Omega-3 Fatty Acids (FISH OIL) 1000 MG CAPS Take one by mouth daily   Yes [provider]  propranolol (INDERAL) 80 MG tablet TAKE 1 TABLET BY MOUTH TWICE A DAY 06/13/21  Yes Lesleigh Noe, MD  vitamin C (ASCORBIC ACID) 500 MG tablet Take 500 mg by mouth daily.   Yes [provider]    No Known Allergies   Social History   Socioeconomic History   Marital status: Single    Spouse name: Not on file   Number of children: 2   Years of education: Trade school   Highest education level: Not on file  Occupational History   Occupation: IT sales professional: [employer]  Tobacco Use   Smoking status: Every Day    Packs/day: 1.00    Years: 25.00    Pack years: 25.00    Types: Cigarettes    Start date: 1995   Smokeless tobacco: Never  Vaping Use   Vaping Use: Never used  Substance and Sexual Activity   Alcohol use: Yes    Alcohol/week: 21.0 standard drinks    Types: 21 Cans of beer per week   Drug use: Yes    Frequency: 7.0 times per week    Types: Marijuana   Sexual activity: Yes    Partners: Female    Birth control/protection: None  Other Topics Concern   Not on file  Social History Narrative    10/30/19   From: the area   Living: alone   Work: delivery driver - for [employer]      Family: 2 adult daughters - John Peters and John Peters - one grandchild      Enjoys: Haematologist, chores - lives on a lot [place] land      Exercise: not lately, 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   Diet: tries to eat healthy - does like some high salt meat but tries to limit      Safety  Seat belts: Yes    Guns: Yes  and secure   Safe in relationships: Yes    Social Determinants of Health   Financial Resource Strain: Not on file  Food Insecurity: Not on file  Transportation Needs: Not on file  Physical Activity: Not on file  Stress: Not on file  Social Connections: Not on file  Intimate Partner Violence: Not on file    Family History  Problem Relation Age of Onset   Colon polyps Father    Hypertension Mother    Hyperlipidemia Mother    Fibromyalgia Mother    Breast cancer Paternal Aunt    Liver cancer Maternal Grandmother     Review of Systems  Constitutional:  Negative for chills and fever.  HENT:  Positive for congestion. Negative for sore throat.   Eyes:  Negative for blurred vision and double vision.  Respiratory:  Negative for cough and shortness of breath.   Cardiovascular:  Negative for chest pain.  Gastrointestinal:  Negative for heartburn, nausea and vomiting.  Genitourinary: Negative.   Musculoskeletal: Negative.  Negative for myalgias.  Skin:  Negative for rash.  Neurological:  Negative for dizziness and headaches.  Endo/Heme/Allergies:  Does not bruise/bleed easily.  Psychiatric/Behavioral:  Negative for depression. The patient is not nervous/anxious.     Physical Exam BP 120/80   Pulse (!) 53   Temp (!) 97.5 F (36.4 C) (Temporal)   Ht 5' 10.5" (1.791 m)   Wt 163 lb 4 oz (74 kg)   SpO2 98%   BMI 23.09 kg/m    BP Readings from Last 3 Encounters:  09/01/21 120/80  08/04/21 (!) 123/96  02/03/21 (!) 146/97      Physical Exam Constitutional:       General: He is not in acute distress.    Appearance: He is well-developed. He is not diaphoretic.  HENT:     Head: Normocephalic and atraumatic.     Right Ear: Tympanic membrane and ear canal normal.     Left Ear: Tympanic membrane and ear canal normal.     Nose: Nose normal.     Mouth/Throat:     Pharynx: Uvula midline.  Eyes:     General: No scleral icterus.    Conjunctiva/sclera: Conjunctivae normal.     Pupils: Pupils are equal, round, and reactive to light.  Cardiovascular:     Rate and Rhythm: Normal rate and regular rhythm.     Heart sounds: Normal heart sounds. No murmur heard. Pulmonary:     Effort: Pulmonary effort is normal. No respiratory distress.     Breath sounds: Normal breath sounds. No wheezing.  Abdominal:     General: Bowel sounds are normal. There is no distension.     Palpations: Abdomen is soft. There is no mass.     Tenderness: There is no abdominal tenderness. There is no guarding.  Musculoskeletal:        General: Normal range of motion.     Cervical back: Normal range of motion and neck supple.  Lymphadenopathy:     Cervical: No cervical adenopathy.  Skin:    General: Skin is warm and dry.     Capillary Refill: Capillary refill takes less than 2 seconds.  Neurological:     Mental Status: He is alert and oriented to person, place, and time.       Results:  PHQ-9:  [place] Office Visit from 09/01/2021 in [place] at [place]  PHQ-9 Total Score 4  09/01/2021    8:36 AM 10/30/2019    9:23 AM 07/24/2018   10:49 AM  Depression screen PHQ 2/9  Decreased Interest 3 0 0  Down, Depressed, Hopeless 0 0 0  PHQ - 2 Score 3 0 0  Altered sleeping 0    Tired, decreased energy 1    Change in appetite 0    Feeling bad or failure about yourself  0    Trouble concentrating 0    Moving slowly or fidgety/restless 0    Suicidal thoughts 0    PHQ-9 Score 4    Difficult doing work/chores Not difficult at all          Assessment: 51 y.o. here for routine annual physical examination.  Plan: Problem List Items Addressed This Visit       Other   Tobacco abuse   Relevant Medications   nicotine polacrilex (NICORETTE) 2 MG gum   nicotine (NICOTINE STEP 1) 21 mg/24hr patch   Other Relevant Orders   Ambulatory Referral for Lung Cancer Scre   HLD (hyperlipidemia)   Relevant Medications   amLODipine (NORVASC) 5 MG tablet   atorvastatin (LIPITOR) 10 MG tablet   lisinopril-hydrochlorothiazide (ZESTORETIC) 20-12.5 MG tablet   propranolol (INDERAL) 80 MG tablet   Other Relevant Orders   Comprehensive metabolic panel   Lipid panel   Other Visit Diagnoses     Annual physical exam    -  Primary   Essential hypertension       Relevant Medications   amLODipine (NORVASC) 5 MG tablet   atorvastatin (LIPITOR) 10 MG tablet   lisinopril-hydrochlorothiazide (ZESTORETIC) 20-12.5 MG tablet   propranolol (INDERAL) 80 MG tablet   Other Relevant Orders   Comprehensive metabolic panel   Prediabetes       Relevant Orders   Hemoglobin A1c   Need for shingles vaccine       Relevant Orders   Varicella-zoster vaccine IM (Completed)       Screening: -- Blood pressure screen normal -- cholesterol screening: will obtain -- Weight screening: normal -- Diabetes Screening: will obtain -- Nutrition: normal - Encouraged healthy diet  The 10-year ASCVD risk score (Arnett DK, et al., 2019) is: 5.4%   Values used to calculate the score:     Age: 84 years     Sex: Male     Is Non-Hispanic African American: No     Diabetic: No     Tobacco smoker: Yes     Systolic Blood Pressure: 353 mmHg     Is BP treated: Yes     HDL Cholesterol: 57.7 mg/dL     Total Cholesterol: 148 mg/dL  -- ASA 81 mg discussed if CVD risk >10% age 53-59 and willing to take for 10 years -- Statin therapy for Age 90-75 with CVD risk >7.5%  Psych -- Depression screening (PHQ-9): negative  Safety -- tobacco screening: using:  discussed quitting using the 5 A's -- alcohol screening: discussed cutting back, he is not interested -- no evidence of domestic violence or intimate partner violence.  Cancer Screening -- Prostate (age 78-69) not indicated -- Colon (age 41-75)  up to date, high risk -- Lung ordered   Immunizations Immunization History  Administered Date(s) Administered   Influenza Whole 02/04/2010   Influenza,inj,quad, With Preservative 12/10/2019   Janssen (J&J) SARS-COV-2 Vaccination 10/20/2019   Td 08/09/1998, 10/16/2008   Tdap 09/02/2020   Zoster Recombinat (Shingrix) 09/01/2021    -- flu vaccine no in season -- TDAP q10 years  up to date -- Shingles (age >50) up to date -- Covid-19 Vaccine up to date  Encouraged regular vision and dental screening. Encouraged healthy exercise and diet.   Lesleigh Noe

## 2021-09-01 NOTE — Patient Instructions (Addendum)
Stop the Afrin - do not use for more than 3 days in a row  Allergies - Start daily allergy pill - claritin, zyrtec, allegra, xyzal (store brand OK) - Start Flonase once daily, increase to twice daily if working   Work on quitting smoking  Or - 3-4 weeks after you start to quit smoking

## 2021-10-31 ENCOUNTER — Other Ambulatory Visit: Payer: Self-pay | Admitting: Family Medicine

## 2021-10-31 DIAGNOSIS — Z720 Tobacco use: Secondary | ICD-10-CM

## 2021-12-15 ENCOUNTER — Ambulatory Visit (INDEPENDENT_AMBULATORY_CARE_PROVIDER_SITE_OTHER): Payer: BC Managed Care – PPO

## 2021-12-15 ENCOUNTER — Ambulatory Visit: Payer: BC Managed Care – PPO

## 2021-12-15 DIAGNOSIS — Z23 Encounter for immunization: Secondary | ICD-10-CM

## 2022-05-25 ENCOUNTER — Other Ambulatory Visit: Payer: Self-pay | Admitting: *Deleted

## 2022-05-25 DIAGNOSIS — Z87891 Personal history of nicotine dependence: Secondary | ICD-10-CM

## 2022-05-25 DIAGNOSIS — Z122 Encounter for screening for malignant neoplasm of respiratory organs: Secondary | ICD-10-CM

## 2022-05-25 DIAGNOSIS — F17210 Nicotine dependence, cigarettes, uncomplicated: Secondary | ICD-10-CM

## 2022-06-15 ENCOUNTER — Encounter: Payer: Self-pay | Admitting: Acute Care

## 2022-06-15 ENCOUNTER — Ambulatory Visit (INDEPENDENT_AMBULATORY_CARE_PROVIDER_SITE_OTHER): Payer: BC Managed Care – PPO | Admitting: Acute Care

## 2022-06-15 DIAGNOSIS — F17200 Nicotine dependence, unspecified, uncomplicated: Secondary | ICD-10-CM | POA: Diagnosis not present

## 2022-06-15 NOTE — Patient Instructions (Signed)
Thank you for participating in the [facility]. It was our pleasure to meet you today. We will call you with the results of your scan within the next few days. Your scan will be assigned a Lung RADS category score by the physicians reading the scans.  This Lung RADS score determines follow up scanning.  See below for description of categories, and follow up screening recommendations. We will be in touch to schedule your follow up screening annually or based on recommendations of our providers. We will fax a copy of your scan results to your Primary Care Physician, or the physician who referred you to the program, to ensure they have the results. Please call the office if you have any questions or concerns regarding your scanning experience or results.  Our office number is [phone]. Please speak with Denise Phelps, RN. , or  Denise Buckner RN, They are  our Lung Cancer Screening RN.'s If They are unavailable when you call, Please leave a message on the voice mail. We will return your call at our earliest convenience.This voice mail is monitored several times a day.  Remember, if your scan is normal, we will scan you annually as long as you continue to meet the criteria for the program. (Age 50-80, Current smoker or smoker who has quit within the last 15 years). If you are a smoker, remember, quitting is the single most powerful action that you can take to decrease your risk of lung cancer and other pulmonary, breathing related problems. We know quitting is hard, and we are here to help.  Please let us know if there is anything we can do to help you meet your goal of quitting. If you are a former smoker, congratulations. We are proud of you! Remain smoke free! Remember you can refer friends or family members through the number above.  We will screen them to make sure they meet criteria for the program. Thank you for helping us take better care of you by  participating in Lung Screening.  You can receive free nicotine replacement therapy ( patches, gum or mints) by calling 1-800-QUIT NOW. Please call so we can get you on the path to becoming  a non-smoker. I know it is hard, but you can do this!  Lung RADS Categories:  Lung RADS 1: no nodules or definitely non-concerning nodules.  Recommendation is for a repeat annual scan in 12 months.  Lung RADS 2:  nodules that are non-concerning in appearance and behavior with a very low likelihood of becoming an active cancer. Recommendation is for a repeat annual scan in 12 months.  Lung RADS 3: nodules that are probably non-concerning , includes nodules with a low likelihood of becoming an active cancer.  Recommendation is for a 6-month repeat screening scan. Often noted after an upper respiratory illness. We will be in touch to make sure you have no questions, and to schedule your 6-month scan.  Lung RADS 4 A: nodules with concerning findings, recommendation is most often for a follow up scan in 3 months or additional testing based on our provider's assessment of the scan. We will be in touch to make sure you have no questions and to schedule the recommended 3 month follow up scan.  Lung RADS 4 B:  indicates findings that are concerning. We will be in touch with you to schedule additional diagnostic testing based on our provider's  assessment of the scan.  Other options for assistance in smoking cessation (   As covered by your insurance benefits)  Hypnosis for smoking cessation  [facility] [phone]  Acupuncture for smoking cessation  [facility] [phone]   

## 2022-06-15 NOTE — Progress Notes (Addendum)
Virtual Visit via Telephone Note  I connected with John Peters on 02/22/21 at  2:00 PM EST by telephone and verified that I am speaking with the correct person using two identifiers.  Location: Patient: Home  Provider: Office    I discussed the limitations, risks, security and privacy concerns of performing an evaluation and management service by telephone and the availability of in person appointments. I also discussed with the patient that there may be a patient responsible charge related to this service. The patient expressed understanding and agreed to proceed.  Shared Decision Making Visit Lung Cancer Screening Program [phone])   Eligibility: Age 52 y.o. Pack Years Smoking History Calculation 45 (# packs/per year x # years smoked) Recent History of coughing up blood  no Unexplained weight loss? no ( >Than 15 pounds within the last 6 months ) Prior History Lung / other cancer no (Diagnosis within the last 5 years already requiring surveillance chest CT Scans). Smoking Status Current Smoker Former Smokers: Years since quit: NA  Quit Date: NA  Visit Components: Discussion included one or more decision making aids. yes Discussion included risk/benefits of screening. yes Discussion included potential follow up diagnostic testing for abnormal scans. yes Discussion included meaning and risk of over diagnosis. yes Discussion included meaning and risk of False Positives. yes Discussion included meaning of total radiation exposure. yes  Counseling Included: Importance of adherence to annual lung cancer LDCT screening. yes Impact of comorbidities on ability to participate in the program. yes Ability and willingness to under diagnostic treatment. yes  Smoking Cessation Counseling: Current Smokers:  Discussed importance of smoking cessation. yes Information about tobacco cessation classes and interventions provided to patient. yes Patient provided with "ticket" for LDCT Scan.  yes Symptomatic Patient. yes  Counseling(Intermediate counseling: > three minutes) [id number] Diagnosis Code: Tobacco Use Z72.0 Asymptomatic Patient no  Counseling NA Former Smokers:  Discussed the importance of maintaining cigarette abstinence. yes Diagnosis Code: Personal History of Nicotine Dependence. [id number] Information about tobacco cessation classes and interventions provided to patient. Yes Patient provided with "ticket" for LDCT Scan. yes Written Order for Lung Cancer Screening with LDCT placed in Epic. Yes (CT Chest Lung Cancer Screening Low Dose W/O CM) [id number] Z12.2-Screening of respiratory organs Z87.891-Personal history of nicotine dependence   I spent 25 minutes of face to face time with him discussing the risks and benefits of lung cancer screening. We viewed a power point together that explained in detail the above noted topics. We took the time to pause the power point at intervals to allow for questions to be asked and answered to ensure understanding. We discussed that he had taken the single most powerful action possible to decrease his risk of developing lung cancer when he quit smoking. I counseled him to remain smoke free, and to contact me if he ever had the desire to smoke again so that I can provide resources and tools to help support the effort to remain smoke free. We discussed the time and location of the scan, and that either  Doroteo Glassman RN or I will call with the results within  24-48 hours of receiving them. he has my card and contact information in the event he needs to speak with me, in addition to a copy of the power point we reviewed as a resource. He verbalized understanding of all of the above and had no further questions upon leaving the office.    I explained to the patient that there has been a high  incidence of coronary artery disease noted on these exams. I explained that this is a non-gated exam therefore degree or severity cannot be determined. This  patient is on statin therapy. I have asked the patient to follow-up with their PCP regarding any incidental finding of coronary artery disease and management with diet or medication as they feel is clinically indicated. The patient verbalized understanding of the above and had no further questions.  I spent 3 minutes counseling on smoking cessation and the health risks of continued tobacco abuse   Trevelle Mcgurn D. Kenton Kingfisher, NP-C [facility] Personal contact information can be found on Amion  06/15/2022, 11:56 AM

## 2022-06-22 ENCOUNTER — Telehealth: Payer: Self-pay

## 2022-06-22 ENCOUNTER — Ambulatory Visit
Admission: RE | Admit: 2022-06-22 | Discharge: 2022-06-22 | Disposition: A | Discharge status: Home / Self Care | Payer: BC Managed Care – PPO | Source: Ambulatory Visit | Attending: Acute Care | Admitting: Acute Care

## 2022-06-22 DIAGNOSIS — Z87891 Personal history of nicotine dependence: Secondary | ICD-10-CM | POA: Insufficient documentation

## 2022-06-22 DIAGNOSIS — Z122 Encounter for screening for malignant neoplasm of respiratory organs: Secondary | ICD-10-CM | POA: Insufficient documentation

## 2022-06-22 DIAGNOSIS — F17210 Nicotine dependence, cigarettes, uncomplicated: Secondary | ICD-10-CM | POA: Insufficient documentation

## 2022-06-22 NOTE — Telephone Encounter (Signed)
Received in S drive fax from [facility] outpt imaging [facility] [place]; spoke with Maudie Mercury at [facility] outpt imaging and she asked to fax to her at [phone] paperwork we received. Kim to have sent to [facility] pulmonology in [place]. Fax transmission log list completed.

## 2022-06-26 ENCOUNTER — Other Ambulatory Visit: Payer: Self-pay

## 2022-06-26 DIAGNOSIS — Z87891 Personal history of nicotine dependence: Secondary | ICD-10-CM

## 2022-06-26 DIAGNOSIS — F17200 Nicotine dependence, unspecified, uncomplicated: Secondary | ICD-10-CM

## 2022-06-26 DIAGNOSIS — F17210 Nicotine dependence, cigarettes, uncomplicated: Secondary | ICD-10-CM

## 2022-06-26 DIAGNOSIS — Z122 Encounter for screening for malignant neoplasm of respiratory organs: Secondary | ICD-10-CM

## 2022-06-29 ENCOUNTER — Encounter: Payer: BC Managed Care – PPO | Admitting: Nurse Practitioner

## 2022-08-25 ENCOUNTER — Other Ambulatory Visit: Payer: Self-pay

## 2022-08-25 DIAGNOSIS — I10 Essential (primary) hypertension: Secondary | ICD-10-CM

## 2022-08-25 NOTE — Telephone Encounter (Signed)
Prescription Request  08/25/2022  LOV: saw Dr Selena Batten 09/01/2021  What is the name of the medication or equipment? Propranolol 80 mg last filled #180 x 3 on 09/01/21 Lisinopril HCTZ 20-12.5 mg last filled # 90 x 3 on 09/01/21 Atorvastatin 10 mg last filled # 90 x3 on 09/01/21 Amlodipine 5 mg last filled # 90 x 3 on 09/01/21.     Have you contacted your pharmacy to request a refill? Yes   Which pharmacy would you like this sent to?    [id number] [place], [place] - [address] AT [facility] [address] [place] [address] Phone: [phone] Fax: [phone]    Patient notified that their request is being sent to the clinical staff for review and that they should receive a response within 2 business days.   Please advise at  [facility]    Pt has TOC appt with Mort Sawyers FNP on 09/07/22 at 8 AM Sending refill request to [facility] FNP.

## 2022-08-27 MED ORDER — AMLODIPINE BESYLATE 5 MG PO TABS: 5.0000 mg | ORAL_TABLET | Freq: Every day | ORAL | 0 refills | Status: DC

## 2022-08-27 MED ORDER — ATORVASTATIN CALCIUM 10 MG PO TABS: 10.0000 mg | ORAL_TABLET | Freq: Every day | ORAL | 0 refills | Status: DC

## 2022-08-27 MED ORDER — LISINOPRIL-HYDROCHLOROTHIAZIDE 20-12.5 MG PO TABS
1.0000 | ORAL_TABLET | Freq: Every day | ORAL | 0 refills | Status: DC
Start: 2022-08-27 — End: 2022-12-05

## 2022-08-27 MED ORDER — PROPRANOLOL HCL 80 MG PO TABS: 80.0000 mg | ORAL_TABLET | Freq: Two times a day (BID) | ORAL | 0 refills | Status: DC

## 2022-09-07 ENCOUNTER — Encounter: Payer: Self-pay | Admitting: Family

## 2022-09-07 ENCOUNTER — Encounter: Payer: BC Managed Care – PPO | Admitting: Nurse Practitioner

## 2022-09-07 ENCOUNTER — Ambulatory Visit: Payer: BC Managed Care – PPO | Admitting: Family

## 2022-09-07 VITALS — BP 136/84 | HR 62 | Temp 97.8°F | Ht 70.5 in | Wt 161.1 lb

## 2022-09-07 DIAGNOSIS — R7309 Other abnormal glucose: Secondary | ICD-10-CM

## 2022-09-07 DIAGNOSIS — I10 Essential (primary) hypertension: Secondary | ICD-10-CM | POA: Diagnosis not present

## 2022-09-07 DIAGNOSIS — E782 Mixed hyperlipidemia: Secondary | ICD-10-CM | POA: Diagnosis not present

## 2022-09-07 DIAGNOSIS — Z0001 Encounter for general adult medical examination with abnormal findings: Secondary | ICD-10-CM | POA: Diagnosis not present

## 2022-09-07 DIAGNOSIS — Z720 Tobacco use: Secondary | ICD-10-CM

## 2022-09-07 DIAGNOSIS — Z789 Other specified health status: Secondary | ICD-10-CM

## 2022-09-07 DIAGNOSIS — F411 Generalized anxiety disorder: Secondary | ICD-10-CM

## 2022-09-07 LAB — BASIC METABOLIC PANEL
BUN: 7 mg/dL (ref 6–23)
CO2: 26 mEq/L (ref 19–32)
Calcium: 9.3 mg/dL (ref 8.4–10.5)
Chloride: 98 mEq/L (ref 96–112)
Creatinine, Ser: 0.85 mg/dL (ref 0.40–1.50)
GFR: 99.03 mL/min (ref >60.00)
Glucose, Bld: 93 mg/dL (ref 70–99)
Potassium: 4 mEq/L (ref 3.5–5.1)
Sodium: 135 mEq/L (ref 135–145)

## 2022-09-07 LAB — CBC
HCT: 42.4 % (ref 39.0–52.0)
Hemoglobin: 14.2 g/dL (ref 13.0–17.0)
MCHC: 33.6 g/dL (ref 30.0–36.0)
MCV: 101.3 fl — ABNORMAL HIGH (ref 78.0–100.0)
Platelets: 237 10*3/uL (ref 150.0–400.0)
RBC: 4.19 Mil/uL — ABNORMAL LOW (ref 4.22–5.81)
RDW: 13.4 % (ref 11.5–15.5)
WBC: 7.5 10*3/uL (ref 4.0–10.5)

## 2022-09-07 LAB — LIPID PANEL
Cholesterol: 155 mg/dL (ref 0–200)
HDL: 53.3 mg/dL (ref >39.00)
LDL Cholesterol: 80 mg/dL (ref 0–99)
NonHDL: 101.21
Total CHOL/HDL Ratio: 3
Triglycerides: 105 mg/dL (ref 0.0–149.0)
VLDL: 21 mg/dL (ref 0.0–40.0)

## 2022-09-07 LAB — MICROALBUMIN / CREATININE URINE RATIO
Creatinine,U: 29.9 mg/dL
Microalb Creat Ratio: 2.3 mg/g (ref 0.0–30.0)
Microalb, Ur: <0.7 mg/dL (ref 0.0–1.9)

## 2022-09-07 LAB — HEMOGLOBIN A1C: Hgb A1c MFr Bld: 5.7 % (ref 4.6–6.5)

## 2022-09-07 NOTE — Progress Notes (Signed)
Established Patient Office Visit  Subjective:  Patient ID: John Peters, male    DOB: 01-14-1971  Age: 52 y.o. MRN: [id number]  CC:  Chief Complaint  Patient presents with   Transitions Of Care   Annual Exam    HPI John Peters is here for a transition of care visit as well as here for annual exam.  Prior provider was: Dr. Selena Batten   Utd on eye exams within last year and dental screenings every six months.  Does not have a living will.  Exercise: very active, walking often.  Diet: regular diet.   chronic concerns:  Tobacco abuse: got an RX for nicotine patch but has not yet tried using them yet. Wasn't quite ready to quit but he wants to try them soon because he is aware he needs to cut down on his smoking. Also does smoke marijuana daily.   CT lung cancer screening: mild centrilobular emphysema, nodule right minor fissure 5.8 mm  Colonoscopy: due, pt did receive a letter to reschedule his repeat colonoscopy. Last was 08/04/21 , repeat due now due to multiple polyps   Alcohol use: drinks everyday, beer, 6+ a day and about two packs of cigarettes a day.   Prediabetes:  Lab Results  Component Value Date   HGBA1C 5.7 09/01/2021   Emphysema: does notice mild sob.   mMRC dyspnea scale, asked in office, on a scale 0-4  0 I only get breathless with strenuous exercise  1 I get short of breath when hurrying on level ground or walking up a slight hill  2 on level ground, I walk slower than people of the same age because of breathlessness      or have to stop for breath when walking my own pace  3 I stop for breath after walking about 100 yards or after a few minutes on level ground  4 I am too breathless to lave the house or I am breathless when dressing   Have you had 2 or more moderate exacerbations or at least 1 hospitalization for COPD exacerbation in the past year? No      Past Medical History:  Diagnosis Date   Anxiety    Hyperlipidemia    Hypertension      Past Surgical History:  Procedure Laterality Date   COLONOSCOPY N/A 02/03/2021   Procedure: COLONOSCOPY;  Surgeon: [name], MD;  Location: [facility] ENDOSCOPY;  Service: Gastroenterology;  Laterality: N/A;  Mother called on Tuesday for an approximate time, and I told her it could change several times between now and Thursday. Mother Zebulun Blythe [phone] she said if he doesn't call for his time to call her and she will give it to him.   COLONOSCOPY WITH PROPOFOL N/A 08/04/2021   Procedure: COLONOSCOPY WITH PROPOFOL;  Surgeon: [name], MD;  Location: [facility] ENDOSCOPY;  Service: Gastroenterology;  Laterality: N/A;   renal ultrasound  08/1998   normal    Family History  Problem Relation Age of Onset   Colon polyps Father    Hypertension Mother    Hyperlipidemia Mother    Fibromyalgia Mother    Breast cancer Paternal Aunt    Liver cancer Maternal Grandmother     Social History   Socioeconomic History   Marital status: Single    Spouse name: Not on file   Number of children: 2   Years of education: Trade school   Highest education level: Not on file  Occupational History   Occupation: Location manager  Employer: [employer]  Tobacco Use   Smoking status: Every Day    Packs/day: 1.25    Years: 36.00    Additional pack years: 0.00    Total pack years: 45.00    Types: Cigarettes    Start date: 1995   Smokeless tobacco: Never  Vaping Use   Vaping Use: Never used  Substance and Sexual Activity   Alcohol use: Yes    Alcohol/week: 21.0 standard drinks of alcohol    Types: 21 Cans of beer per week   Drug use: Yes    Frequency: 7.0 times per week    Types: Marijuana   Sexual activity: Yes    Partners: Female    Birth control/protection: None  Other Topics Concern   Not on file  Social History Narrative   10/30/19   From: the area   Living: alone   Work: delivery driver - for [employer]      Family: 2 adult daughters - Ladona Ridgel and Laureen Ochs - one  grandchild      Enjoys: Presenter, broadcasting, chores - lives [place] a lot [place] land      Exercise: not lately, 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 90 tablet 0   aspirin EC 81 MG tablet Take 81 mg by mouth daily.     atorvastatin (LIPITOR) 10 MG tablet Take 1 tablet (10 mg total) by mouth daily. 90 tablet 0   lisinopril-hydrochlorothiazide (ZESTORETIC) 20-12.5 MG tablet Take 1 tablet by mouth daily. 90 tablet 0   nicotine (NICOTINE STEP 1) 21 mg/24hr patch Place 1 patch (21 mg total) onto the skin daily. 28 patch 0   nicotine polacrilex (NICORETTE) 2 MG gum Take 1 each (2 mg total) by mouth as needed for smoking cessation. 100 tablet 0   Omega-3 Fatty Acids (FISH OIL) 1000 MG CAPS Take one by mouth daily     propranolol (INDERAL) 80 MG tablet Take 1 tablet (80 mg total) by mouth 2 (two) times daily. 180 tablet 0   vitamin C (ASCORBIC ACID) 500 MG tablet Take 500 mg by mouth daily.     No facility-administered medications prior to visit.    No Known Allergies  ROS: Pertinent symptoms negative unless otherwise noted in HPI      Objective:    Physical Exam Constitutional:      General: He is not in acute distress.    Appearance: Normal appearance. He is normal weight. He is not ill-appearing, toxic-appearing or diaphoretic.  HENT:     Head: Normocephalic.     Right Ear: Tympanic membrane normal.     Left Ear:  Tympanic membrane normal.     Nose: Nose normal.  Eyes:     Pupils: Pupils are equal, round, and reactive to light.  Cardiovascular:  Rate and Rhythm: Normal rate and regular rhythm.  Pulmonary:     Effort: Pulmonary effort is normal.     Breath sounds: Normal breath sounds.  Abdominal:     General: Abdomen is flat. Bowel sounds are normal.     Palpations: Abdomen is soft.     Tenderness: There is no abdominal tenderness.  Musculoskeletal:        General: Normal range of motion.     Cervical back: Normal range of motion.  Skin:    General: Skin is warm.  Neurological:     General: No focal deficit present.     Mental Status: He is alert and oriented to person, place, and time.     Motor: No weakness.     Gait: Gait normal.  Psychiatric:        Mood and Affect: Mood normal.        Behavior: Behavior normal.        Thought Content: Thought content normal.        Judgment: Judgment normal.       BP 136/84 (BP Location: Left Arm, Patient Position: Sitting, Cuff Size: Normal)   Pulse 62   Temp 97.8 F (36.6 C) (Temporal)   Ht 5' 10.5" (1.791 m)   Wt 161 lb 2 oz (73.1 kg)   SpO2 97%   BMI 22.79 kg/m  Wt Readings from Last 3 Encounters:  09/07/22 161 lb 2 oz (73.1 kg)  09/01/21 163 lb 4 oz (74 kg)  08/04/21 155 lb (70.3 kg)     Health Maintenance Due  Topic Date Due   Colonoscopy  08/05/2022    There are no preventive care reminders to display for this patient.  Lab Results  Component Value Date   TSH 1.04 09/18/2019   Lab Results  Component Value Date   WBC 10.0 09/18/2019   HGB 15.1 09/18/2019   HCT 44.7 09/18/2019   MCV 102.0 (H) 09/18/2019   PLT 249.0 09/18/2019   Lab Results  Component Value Date   NA 136 09/01/2021   K 4.2 09/01/2021   CO2 29 09/01/2021   GLUCOSE 98 09/01/2021   BUN 12 09/01/2021   CREATININE 0.91 09/01/2021   BILITOT 0.5 09/01/2021   ALKPHOS 68 09/01/2021   AST 23 09/01/2021   ALT 16 09/01/2021   PROT 7.9 09/01/2021    ALBUMIN 4.7 09/01/2021   CALCIUM 9.4 09/01/2021   ANIONGAP 10 09/22/2016   GFR 96.74 09/01/2021   Lab Results  Component Value Date   CHOL 159 09/01/2021   Lab Results  Component Value Date   HDL 57.50 09/01/2021   Lab Results  Component Value Date   LDLCALC 79 09/01/2021   Lab Results  Component Value Date   TRIG 112.0 09/01/2021   Lab Results  Component Value Date   CHOLHDL 3 09/01/2021   Lab Results  Component Value Date   HGBA1C 5.7 09/01/2021      Assessment & Plan:   Primary hypertension Assessment & Plan: Continue amlodipine 5 mg once daily and also lisionpril hctz 20-12.5 mg once daily.  Stable.   Orders: -     Basic metabolic panel -     Microalbumin / creatinine urine ratio  Tobacco abuse Assessment & Plan: Smoking cessation instruction/counseling given:  counseled patient on the dangers of tobacco use, advised patient to stop smoking, and reviewed strategies to maximize success  Pt strongly encouraged to choose a date to quit, and he has nicotine patches already  and states he is going to shoot for his birthday to stop.      Mixed hyperlipidemia Assessment & Plan: Continue atorvastatin 10 mg once daily.  Ordered lipid panel, pending results. Work on low cholesterol diet and exercise as tolerated   Orders: -     Lipid panel  Elevated glucose Assessment & Plan: Pt advised of the following: Work on a diabetic diet, try to incorporate exercise at least 20-30 a day for 3 days a week or more.    Orders: -     Hemoglobin A1c  Encounter for general adult medical examination with abnormal findings Assessment & Plan: Patient Counseling(The following topics were reviewed):  Preventative care handout given to pt  Health maintenance and immunizations reviewed. Please refer to Health maintenance section. Pt advised on safe sex, wearing seatbelts in car, and proper nutrition labwork ordered today for annual Dental health: Discussed importance  of regular tooth brushing, flossing, and dental visits.'e  Orders: -     Lipid panel -     Basic metabolic panel -     CBC -     Hemoglobin A1c  Anxiety state Assessment & Plan: Did recommend wellbutrin as this may also help to stave cravings, pt to consider in future declines for now.   Alcohol use Assessment & Plan: alcohol cessation instruction/counseling given: counseled patient on the dangers of alcohol abuse/use and advised pt to stop drinking and reviewed strategies to maximize success.       No orders of the defined types were placed in this encounter.   Follow-up: Return in about 6 months (around 03/10/2023) for f/u blood pressure, f/u cholesterol.    Mort Sawyers, FNP

## 2022-09-07 NOTE — Assessment & Plan Note (Signed)
Pt advised of the following: Work on a diabetic diet, try to incorporate exercise at least 20-30 a day for 3 days a week or more.   

## 2022-09-07 NOTE — Assessment & Plan Note (Signed)
Patient Counseling(The following topics were reviewed):  Preventative care handout given to pt  Health maintenance and immunizations reviewed. Please refer to Health maintenance section. Pt advised on safe sex, wearing seatbelts in car, and proper nutrition labwork ordered today for annual Dental health: Discussed importance of regular tooth brushing, flossing, and dental visits.'e

## 2022-09-07 NOTE — Assessment & Plan Note (Signed)
Smoking cessation instruction/counseling given:  counseled patient on the dangers of tobacco use, advised patient to stop smoking, and reviewed strategies to maximize success  Pt strongly encouraged to choose a date to quit, and he has nicotine patches already and states he is going to shoot for his birthday to stop.

## 2022-09-07 NOTE — Assessment & Plan Note (Signed)
Continue atorvastatin 10 mg once daily.  Ordered lipid panel, pending results. Work on low cholesterol diet and exercise as tolerated  

## 2022-09-07 NOTE — Assessment & Plan Note (Signed)
alcohol cessation instruction/counseling given: counseled patient on the dangers of alcohol abuse/use and advised pt to stop drinking and reviewed strategies to maximize success.   

## 2022-09-07 NOTE — Assessment & Plan Note (Signed)
Continue amlodipine 5 mg once daily and also lisionpril hctz 20-12.5 mg once daily.  Stable.

## 2022-09-07 NOTE — Assessment & Plan Note (Signed)
Did recommend wellbutrin as this may also help to stave cravings, pt to consider in future declines for now.

## 2022-09-11 NOTE — Progress Notes (Signed)
Labs overall good!  Mildly prediabetic , work on diabetic diet.  Cholesterol looks good.

## 2022-10-26 ENCOUNTER — Telehealth: Payer: Self-pay | Admitting: Gastroenterology

## 2022-10-26 NOTE — Telephone Encounter (Addendum)
John Peters, calls like these go to [facility] or [facility]. They are our colonoscopy schedulers (from referrals or recall list). Thank you.

## 2022-10-26 NOTE — Telephone Encounter (Signed)
Patient called in to schedule colonoscopy. If you can today the patient would like for you to call [phone]. Any other day call his cell phone. Per Dr.Anna The polyp removed from your colon was benign, but precancerous. This means that it had the potential to change into cancer over time had it not been removed.   I recommend you have a repeat colonoscopy in 1 year to determine if you have developed any new polyps and to screen for colorectal cancer.

## 2022-11-02 ENCOUNTER — Telehealth: Payer: Self-pay

## 2022-11-02 ENCOUNTER — Other Ambulatory Visit: Payer: Self-pay

## 2022-11-02 DIAGNOSIS — Z86010 Personal history of colonic polyps: Secondary | ICD-10-CM

## 2022-11-02 MED ORDER — NA SULFATE-K SULFATE-MG SULF 17.5-3.13-1.6 GM/177ML PO SOLN
1.0000 | Freq: Once | ORAL | 0 refills | Status: AC
Start: 2022-11-02 — End: 2022-11-02

## 2022-11-02 NOTE — Telephone Encounter (Signed)
Patient need to schedule and colonoscopy.

## 2022-11-02 NOTE — Telephone Encounter (Signed)
Gastroenterology Pre-Procedure Review  Request Date: 01/04/23 Requesting Physician: Dr. Tobi Bastos  PATIENT REVIEW QUESTIONS: The patient responded to the following health history questions as indicated:    1. Are you having any GI issues? no 2. Do you have a personal history of Polyps? yes (last colonoscopy 08/04/21 with Dr. Tobi Bastos.  He recommended repeat in 1 year) 3. Do you have a family history of Colon Cancer or Polyps? yes (father colon polyps) 4. Diabetes Mellitus? no 5. Joint replacements in the past 12 months?no 6. Major health problems in the past 3 months?no 7. Any artificial heart valves, MVP, or defibrillator?no    MEDICATIONS & ALLERGIES:    Patient reports the following regarding taking any anticoagulation/antiplatelet therapy:   Plavix, Coumadin, Eliquis, Xarelto, Lovenox, Pradaxa, Brilinta, or Effient? no Aspirin? 81 mg daily  Patient confirms/reports the following medications:  Current Outpatient Medications  Medication Sig Dispense Refill   amLODipine (NORVASC) 5 MG tablet Take 1 tablet (5 mg total) by mouth daily. 90 tablet 0   aspirin EC 81 MG tablet Take 81 mg by mouth daily.     atorvastatin (LIPITOR) 10 MG tablet Take 1 tablet (10 mg total) by mouth daily. 90 tablet 0   lisinopril-hydrochlorothiazide (ZESTORETIC) 20-12.5 MG tablet Take 1 tablet by mouth daily. 90 tablet 0   nicotine (NICOTINE STEP 1) 21 mg/24hr patch Place 1 patch (21 mg total) onto the skin daily. 28 patch 0   nicotine polacrilex (NICORETTE) 2 MG gum Take 1 each (2 mg total) by mouth as needed for smoking cessation. 100 tablet 0   Omega-3 Fatty Acids (FISH OIL) 1000 MG CAPS Take one by mouth daily     propranolol (INDERAL) 80 MG tablet Take 1 tablet (80 mg total) by mouth 2 (two) times daily. 180 tablet 0   vitamin C (ASCORBIC ACID) 500 MG tablet Take 500 mg by mouth daily.     No current facility-administered medications for this visit.    Patient confirms/reports the following allergies:  No  Known Allergies  No orders of the defined types were placed in this encounter.   AUTHORIZATION INFORMATION Primary Insurance: 1D#: Group #:  Secondary Insurance: 1D#: Group #:  SCHEDULE INFORMATION: Date: 01/04/23 Time: Location: [facility]

## 2022-11-22 ENCOUNTER — Other Ambulatory Visit: Payer: Self-pay | Admitting: Family

## 2022-11-22 DIAGNOSIS — I10 Essential (primary) hypertension: Secondary | ICD-10-CM

## 2022-12-01 ENCOUNTER — Telehealth: Payer: Self-pay | Admitting: Family

## 2022-12-01 NOTE — Telephone Encounter (Signed)
Prescription Request  12/01/2022  LOV: 09/07/2022  What is the name of the medication or equipment?  amLODipine (NORVASC) 5 MG tablet  atorvastatin (LIPITOR) 10 MG tablet   lisinopril-hydrochlorothiazide (ZESTORETIC) 20-12.5 MG tablet   propranolol (INDERAL) 80 MG tablet   Have you contacted your pharmacy to request a refill? Yes   Which pharmacy would you like this sent to?    [id number] [place], [place] - [address] AT [facility] [address] [place] [address] Phone: [phone] Fax: [phone]    Patient notified that their request is being sent to the clinical staff for review and that they should receive a response within 2 business days.   Please advise at Mobile [phone] (mobile)

## 2022-12-04 NOTE — Telephone Encounter (Signed)
Patient called in to ask about these medication being sent in for him,he stated that he's currently out.

## 2022-12-04 NOTE — Telephone Encounter (Signed)
Pt has called back and reported that he is completely out of the attached medications.

## 2022-12-04 NOTE — Telephone Encounter (Signed)
Attempted to call pt back.  No answer.  Left a message that refills have been sent to [facility] for signature.

## 2022-12-04 NOTE — Telephone Encounter (Signed)
Duplicate message.  Refills have been pended and sent to prescriber.

## 2022-12-28 ENCOUNTER — Encounter: Payer: Self-pay | Admitting: Gastroenterology

## 2023-01-03 ENCOUNTER — Encounter: Payer: Self-pay | Admitting: Gastroenterology

## 2023-01-04 ENCOUNTER — Encounter: Payer: Self-pay | Admitting: Gastroenterology

## 2023-01-04 ENCOUNTER — Ambulatory Visit: Payer: BC Managed Care – PPO | Admitting: Anesthesiology

## 2023-01-04 ENCOUNTER — Other Ambulatory Visit: Payer: Self-pay

## 2023-01-04 ENCOUNTER — Encounter
Admission: RE | Disposition: A | Discharge status: Home / Self Care | Payer: Self-pay | Source: Home / Self Care | Attending: Gastroenterology

## 2023-01-04 ENCOUNTER — Ambulatory Visit
Admission: RE | Admit: 2023-01-04 | Discharge: 2023-01-04 | Disposition: A | Discharge status: Home / Self Care | Payer: BC Managed Care – PPO | Attending: Gastroenterology | Admitting: Gastroenterology

## 2023-01-04 DIAGNOSIS — D125 Benign neoplasm of sigmoid colon: Secondary | ICD-10-CM

## 2023-01-04 DIAGNOSIS — F17210 Nicotine dependence, cigarettes, uncomplicated: Secondary | ICD-10-CM | POA: Diagnosis not present

## 2023-01-04 DIAGNOSIS — Z8349 Family history of other endocrine, nutritional and metabolic diseases: Secondary | ICD-10-CM | POA: Diagnosis not present

## 2023-01-04 DIAGNOSIS — Z1211 Encounter for screening for malignant neoplasm of colon: Secondary | ICD-10-CM | POA: Insufficient documentation

## 2023-01-04 DIAGNOSIS — D122 Benign neoplasm of ascending colon: Secondary | ICD-10-CM | POA: Diagnosis not present

## 2023-01-04 DIAGNOSIS — K635 Polyp of colon: Secondary | ICD-10-CM | POA: Diagnosis not present

## 2023-01-04 DIAGNOSIS — I10 Essential (primary) hypertension: Secondary | ICD-10-CM | POA: Diagnosis not present

## 2023-01-04 DIAGNOSIS — Z8249 Family history of ischemic heart disease and other diseases of the circulatory system: Secondary | ICD-10-CM | POA: Diagnosis not present

## 2023-01-04 DIAGNOSIS — Z860100 Personal history of colon polyps, unspecified: Secondary | ICD-10-CM

## 2023-01-04 DIAGNOSIS — Z83719 Family history of colon polyps, unspecified: Secondary | ICD-10-CM | POA: Insufficient documentation

## 2023-01-04 DIAGNOSIS — D126 Benign neoplasm of colon, unspecified: Secondary | ICD-10-CM

## 2023-01-04 DIAGNOSIS — E785 Hyperlipidemia, unspecified: Secondary | ICD-10-CM | POA: Diagnosis not present

## 2023-01-04 HISTORY — PX: POLYPECTOMY: SHX5525

## 2023-01-04 HISTORY — PX: COLONOSCOPY WITH PROPOFOL: SHX5780

## 2023-01-04 SURGERY — COLONOSCOPY WITH PROPOFOL
Anesthesia: General

## 2023-01-04 MED ORDER — PROPOFOL 10 MG/ML IV BOLUS
INTRAVENOUS | Status: DC | PRN
  Administered 2023-01-04: 100 mg via INTRAVENOUS
  Administered 2023-01-04: 130 ug/kg/min via INTRAVENOUS

## 2023-01-04 MED ORDER — PROPOFOL 1000 MG/100ML IV EMUL
INTRAVENOUS | Status: AC
  Filled 2023-01-04: qty 100

## 2023-01-04 MED ORDER — SODIUM CHLORIDE 0.9 % IV SOLN: INTRAVENOUS | Status: DC

## 2023-01-04 MED ORDER — PROPOFOL 10 MG/ML IV BOLUS
INTRAVENOUS | Status: AC
  Filled 2023-01-04: qty 20

## 2023-01-04 MED ORDER — LIDOCAINE HCL (PF) 2 % IJ SOLN
INTRAMUSCULAR | Status: AC
  Filled 2023-01-04: qty 5

## 2023-01-04 MED ORDER — LIDOCAINE HCL (CARDIAC) PF 100 MG/5ML IV SOSY
PREFILLED_SYRINGE | INTRAVENOUS | Status: DC | PRN
  Administered 2023-01-04: 50 mg via INTRAVENOUS

## 2023-01-04 NOTE — Transfer of Care (Signed)
Immediate Anesthesia Transfer of Care Note  Patient: John Peters  Procedure(s) Performed: COLONOSCOPY WITH PROPOFOL POLYPECTOMY  Patient Location: PACU and Endoscopy Unit  Anesthesia Type:General  Level of Consciousness: drowsy and patient cooperative  Airway & Oxygen Therapy: Patient Spontanous Breathing  Post-op Assessment: Report given to RN and Post -op Vital signs reviewed and stable  Post vital signs: Reviewed and stable  Last Vitals:  Vitals Value Taken Time  BP 113/85 01/04/23 0809  Temp 36.1 C 01/04/23 0809  Pulse 75 01/04/23 0809  Resp 19 01/04/23 0809  SpO2 98 % 01/04/23 0809    Last Pain:  Vitals:   01/04/23 0809  TempSrc: Temporal  PainSc: 0-No pain         Complications: No notable events documented.

## 2023-01-04 NOTE — Op Note (Signed)
[facility] Gastroenterology Patient Name: John Peters Procedure Date: 01/04/2023 7:47 AM MRN: [id number] Account #: [id number] Date of Birth: 12/22/1970 Admit Type: Outpatient Age: 52 Room: [facility] ENDO ROOM 3 Gender: Male Note Status: Finalized Instrument Name: Prentice Docker [id number] Procedure:             Colonoscopy Indications:           Surveillance: Piecemeal removal of large sessile                         adenoma last colonoscopy (< 3 yrs) Providers:             [name] MD, MD Referring MD:          [name] MD, MD (Referring MD), Mort Sawyers                         (Referring MD) Medicines:             Monitored Anesthesia Care Complications:         No immediate complications. Procedure:             Pre-Anesthesia Assessment:                        - Prior to the procedure, a History and Physical was                         performed, and patient medications, allergies and                         sensitivities were reviewed. The patient's tolerance                         of previous anesthesia was reviewed.                        - The risks and benefits of the procedure and the                         sedation options and risks were discussed with the                         patient. All questions were answered and informed                         consent was obtained.                        - ASA Grade Assessment: II - A patient with mild                         systemic disease.                        After obtaining informed consent, the colonoscope was                         passed under direct vision. Throughout the procedure,                         the patient's blood  pressure, pulse, and oxygen                         saturations were monitored continuously. The                         Colonoscope was introduced through the anus and                         advanced to the the cecum, identified by the                         appendiceal  orifice. The colonoscopy was performed                         with ease. The patient tolerated the procedure well.                         The quality of the bowel preparation was adequate. The                         ileocecal valve, appendiceal orifice, and rectum were                         photographed. Findings:      The perianal and digital rectal examinations were normal.      A 9 mm polyp was found in the ascending colon. The polyp was sessile.       The polyp was removed with a cold snare. Resection and retrieval were       complete.      A 5 mm polyp was found in the sigmoid colon. The polyp was sessile. The       polyp was removed with a cold snare. Resection was complete, but the       polyp tissue was not retrieved.      The exam was otherwise without abnormality on direct and retroflexion       views. Impression:            - One 9 mm polyp in the ascending colon, removed with                         a cold snare. Resected and retrieved.                        - One 5 mm polyp in the sigmoid colon, removed with a                         cold snare. Complete resection. Polyp tissue not                         retrieved.                        - The examination was otherwise normal on direct and                         retroflexion views. Recommendation:        - Discharge patient to home (with escort).                        -  Resume previous diet.                        - Continue present medications.                        - Await pathology results.                        - Repeat colonoscopy in 3 years for surveillance. Procedure Code(s):     --- Professional ---                        [phone], Colonoscopy, flexible; with removal of                         tumor(s), polyp(s), or other lesion(s) by snare                         technique Diagnosis Code(s):     --- Professional ---                        Z86.010, Personal history of colonic polyps                         D12.2, Benign neoplasm of ascending colon                        D12.5, Benign neoplasm of sigmoid colon CPT copyright 2022 American Medical Association. All rights reserved. The codes documented in this report are preliminary and upon coder review may  be revised to meet current compliance requirements. [name], MD [name] MD, MD 01/04/2023 8:08:15 AM This report has been signed electronically. Number of Addenda: 0 Note Initiated On: 01/04/2023 7:47 AM Scope Withdrawal Time: 0 hours 12 minutes 7 seconds  Total Procedure Duration: 0 hours 14 minutes 40 seconds  Estimated Blood Loss:  Estimated blood loss: none.      [facility]

## 2023-01-04 NOTE — Anesthesia Preprocedure Evaluation (Signed)
Anesthesia Evaluation  Patient identified by MRN, date of birth, ID band Patient awake    Reviewed: Allergy & Precautions, NPO status , Patient's Chart, lab work & pertinent test results  Airway Mallampati: II  TM Distance: >3 FB Neck ROM: full    Dental  (+) Teeth Intact   Pulmonary neg pulmonary ROS, Current SmokerPatient did not abstain from smoking.   Pulmonary exam normal  + decreased breath sounds+ wheezing      Cardiovascular Exercise Tolerance: Good hypertension, Pt. on medications negative cardio ROS Normal cardiovascular exam Rhythm:Regular Rate:Normal     Neuro/Psych   Anxiety     negative neurological ROS  negative psych ROS   GI/Hepatic negative GI ROS, Neg liver ROS,,,  Endo/Other  negative endocrine ROS    Renal/GU negative Renal ROS  negative genitourinary   Musculoskeletal   Abdominal Normal abdominal exam  (+)   Peds negative pediatric ROS (+)  Hematology negative hematology ROS (+)   Anesthesia Other Findings Past Medical History: No date: Anxiety No date: Hyperlipidemia No date: Hypertension  Past Surgical History: 02/03/2021: COLONOSCOPY; N/A     Comment:  Procedure: COLONOSCOPY;  Surgeon: [name], MD;                Location: [facility] ENDOSCOPY;  Service: Gastroenterology;                Laterality: N/A;  Mother called on Tuesday for an               approximate time, and I told her it could change several               times between now and Thursday. Mother Chesney Bermudez               [phone] she said if he doesn't call for his time to               call her and she will give it to him. 08/04/2021: COLONOSCOPY WITH PROPOFOL; N/A     Comment:  Procedure: COLONOSCOPY WITH PROPOFOL;  Surgeon: [name], MD;  Location: [facility] ENDOSCOPY;  Service:               Gastroenterology;  Laterality: N/A; 08/1998: renal ultrasound     Comment:  normal      Reproductive/Obstetrics negative OB ROS                             Anesthesia Physical Anesthesia Plan  ASA: 2  Anesthesia Plan: General   Post-op Pain Management:    Induction: Intravenous  PONV Risk Score and Plan: Propofol infusion and TIVA  Airway Management Planned: Natural Airway  Additional Equipment:   Intra-op Plan:   Post-operative Plan:   Informed Consent: I have reviewed the patients History and Physical, chart, labs and discussed the procedure including the risks, benefits and alternatives for the proposed anesthesia with the patient or authorized representative who has indicated his/her understanding and acceptance.     Dental Advisory Given  Plan Discussed with: CRNA and Surgeon  Anesthesia Plan Comments:        Anesthesia Quick Evaluation

## 2023-01-04 NOTE — Anesthesia Postprocedure Evaluation (Signed)
Anesthesia Post Note  Patient: John Peters  Procedure(s) Performed: COLONOSCOPY WITH PROPOFOL POLYPECTOMY  Patient location during evaluation: PACU Anesthesia Type: General Level of consciousness: awake Pain management: pain level controlled Vital Signs Assessment: post-procedure vital signs reviewed and stable Respiratory status: spontaneous breathing and nonlabored ventilation Cardiovascular status: blood pressure returned to baseline Anesthetic complications: no   No notable events documented.   Last Vitals:  Vitals:   01/04/23 0819 01/04/23 0829  BP: 126/88 (!) 150/95  Pulse: 67 62  Resp: 19   Temp:    SpO2: 100% 100%    Last Pain:  Vitals:   01/04/23 0829  TempSrc:   PainSc: 0-No pain                 VAN STAVEREN,Maanav Kassabian

## 2023-01-04 NOTE — H&P (Signed)
[name], MD [address], [place], [place], [address] [address], [address], [place], [place], [address] Phone: [phone]  Fax: [phone]  Primary Care Physician:  Mort Sawyers, FNP   Pre-Procedure History & Physical: HPI:  John Peters is a 52 y.o. male is here for an colonoscopy.   Past Medical History:  Diagnosis Date   Anxiety    Hyperlipidemia    Hypertension     Past Surgical History:  Procedure Laterality Date   COLONOSCOPY N/A 02/03/2021   Procedure: COLONOSCOPY;  Surgeon: [name], MD;  Location: [facility] ENDOSCOPY;  Service: Gastroenterology;  Laterality: N/A;  Mother called on Tuesday for an approximate time, and I told her it could change several times between now and Thursday. Mother Sheppard Mcclammy [phone] she said if he doesn't call for his time to call her and she will give it to him.   COLONOSCOPY WITH PROPOFOL N/A 08/04/2021   Procedure: COLONOSCOPY WITH PROPOFOL;  Surgeon: [name], MD;  Location: [facility] ENDOSCOPY;  Service: Gastroenterology;  Laterality: N/A;   renal ultrasound  08/1998   normal    Prior to Admission medications   Medication Sig Start Date End Date Taking? Authorizing Provider  amLODipine (NORVASC) 5 MG tablet TAKE 1 TABLET (5 MG TOTAL) BY MOUTH DAILY. 12/05/22  Yes Mort Sawyers, FNP  aspirin EC 81 MG tablet Take 81 mg by mouth daily.   Yes [provider]  atorvastatin (LIPITOR) 10 MG tablet TAKE 1 TABLET BY MOUTH EVERY DAY 12/05/22  Yes Dugal, Tabitha, FNP  lisinopril-hydrochlorothiazide (ZESTORETIC) 20-12.5 MG tablet TAKE 1 TABLET BY MOUTH EVERY DAY 12/05/22  Yes Dugal, Tabitha, FNP  nicotine (NICOTINE STEP 1) 21 mg/24hr patch Place 1 patch (21 mg total) onto the skin daily. 09/01/21  Yes Gweneth Dimitri, MD  nicotine polacrilex (NICORETTE) 2 MG gum Take 1 each (2 mg total) by mouth as needed for smoking cessation. 09/01/21  Yes Gweneth Dimitri, MD  Omega-3 Fatty Acids (FISH OIL) 1000 MG CAPS Take one by  mouth daily   Yes [provider]  propranolol (INDERAL) 80 MG tablet TAKE 1 TABLET BY MOUTH 2 TIMES DAILY. 12/05/22  Yes Dugal, Wyatt Mage, FNP  vitamin C (ASCORBIC ACID) 500 MG tablet Take 500 mg by mouth daily.   Yes [provider]    Allergies as of 11/02/2022   (No Known Allergies)    Family History  Problem Relation Age of Onset   Colon polyps Father    Hypertension Mother    Hyperlipidemia Mother    Fibromyalgia Mother    Breast cancer Paternal Aunt    Liver cancer Maternal Grandmother     Social History   Socioeconomic History   Marital status: Single    Spouse name: Not on file   Number of children: 2   Years of education: Trade school   Highest education level: Not on file  Occupational History   Occupation: Chief of Staff: [employer]  Tobacco Use   Smoking status: Every Day    Current packs/day: 1.25    Average packs/day: 1.3 packs/day for 36.0 years (45.0 ttl pk-yrs)    Types: Cigarettes    Start date: 1995   Smokeless tobacco: Never  Vaping Use   Vaping status: Every Day  Substance and Sexual Activity   Alcohol use: Yes    Alcohol/week: 21.0 standard drinks of alcohol    Types: 21 Cans of beer per week   Drug use: Yes  Frequency: 7.0 times per week    Types: Marijuana    Comment: 01/03/23   Sexual activity: Yes    Partners: Female    Birth control/protection: None  Other Topics Concern   Not on file  Social History Narrative   10/30/19   From: the area   Living: alone   Work: delivery driver - for [employer]      Family: 2 adult daughters - Ladona Ridgel and Laureen Ochs - one grandchild      Enjoys: Presenter, broadcasting, chores - lives [place] a lot [place] land      Exercise: not lately, 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   Diet: tries to eat healthy - does like some high salt meat but tries to limit      Safety   Seat belts: Yes    Guns: Yes  and secure   Safe in relationships: Yes    Social Determinants of Manufacturing engineer Strain: Not on file  Food Insecurity: Not on file  Transportation Needs: Not on file  Physical Activity: Not on file  Stress: Not on file  Social Connections: Not on file  Intimate Partner Violence: Not on file    Review of Systems: See HPI, otherwise negative ROS  Physical Exam: BP (!) 128/94   Pulse 63   Temp (!) 97.2 F (36.2 C) (Temporal)   Resp 18   Ht 5\' 11"  (1.803 m)   Wt 70.3 kg   SpO2 96%   BMI 21.62 kg/m  General:   Alert,  pleasant and cooperative in NAD Head:  Normocephalic and atraumatic. Neck:  Supple; no masses or thyromegaly. Lungs:  Clear throughout to auscultation, normal respiratory effort.    Heart:  +S1, +S2, Regular rate and rhythm, No edema. Abdomen:  Soft, nontender and nondistended. Normal bowel sounds, without guarding, and without rebound.   Neurologic:  Alert and  oriented x4;  grossly normal neurologically.  Impression/Plan: John Peters is here for an colonoscopy to be performed for surveillance due to prior history of colon polyps   Risks, benefits, limitations, and alternatives regarding  colonoscopy have been reviewed with the patient.  Questions have been answered.  All parties agreeable.   [name], MD  01/04/2023, 7:46 AM

## 2023-01-05 LAB — SURGICAL PATHOLOGY

## 2023-01-08 ENCOUNTER — Encounter: Payer: Self-pay | Admitting: Gastroenterology

## 2023-01-08 NOTE — Progress Notes (Signed)
noted 

## 2023-02-26 ENCOUNTER — Other Ambulatory Visit: Payer: Self-pay | Admitting: Family

## 2023-02-26 DIAGNOSIS — I10 Essential (primary) hypertension: Secondary | ICD-10-CM

## 2023-05-25 ENCOUNTER — Other Ambulatory Visit: Payer: Self-pay | Admitting: Family

## 2023-05-25 DIAGNOSIS — I10 Essential (primary) hypertension: Secondary | ICD-10-CM

## 2023-06-21 ENCOUNTER — Other Ambulatory Visit: Payer: Self-pay | Admitting: Acute Care

## 2023-06-21 DIAGNOSIS — Z122 Encounter for screening for malignant neoplasm of respiratory organs: Secondary | ICD-10-CM

## 2023-06-21 DIAGNOSIS — Z87891 Personal history of nicotine dependence: Secondary | ICD-10-CM

## 2023-06-21 DIAGNOSIS — F17210 Nicotine dependence, cigarettes, uncomplicated: Secondary | ICD-10-CM

## 2023-06-25 ENCOUNTER — Ambulatory Visit

## 2023-06-28 ENCOUNTER — Ambulatory Visit
Admission: RE | Admit: 2023-06-28 | Discharge: 2023-06-28 | Disposition: A | Discharge status: Home / Self Care | Source: Ambulatory Visit | Attending: Diagnostic Radiology | Admitting: Diagnostic Radiology

## 2023-06-28 DIAGNOSIS — J439 Emphysema, unspecified: Secondary | ICD-10-CM | POA: Insufficient documentation

## 2023-06-28 DIAGNOSIS — F17210 Nicotine dependence, cigarettes, uncomplicated: Secondary | ICD-10-CM

## 2023-06-28 DIAGNOSIS — Z87891 Personal history of nicotine dependence: Secondary | ICD-10-CM

## 2023-06-28 DIAGNOSIS — I700 Atherosclerosis of aorta: Secondary | ICD-10-CM | POA: Diagnosis not present

## 2023-06-28 DIAGNOSIS — I2510 Atherosclerotic heart disease of native coronary artery without angina pectoris: Secondary | ICD-10-CM | POA: Diagnosis not present

## 2023-06-28 DIAGNOSIS — Z122 Encounter for screening for malignant neoplasm of respiratory organs: Secondary | ICD-10-CM

## 2023-07-31 ENCOUNTER — Other Ambulatory Visit: Payer: Self-pay | Admitting: Acute Care

## 2023-07-31 DIAGNOSIS — Z122 Encounter for screening for malignant neoplasm of respiratory organs: Secondary | ICD-10-CM

## 2023-07-31 DIAGNOSIS — Z87891 Personal history of nicotine dependence: Secondary | ICD-10-CM

## 2023-07-31 DIAGNOSIS — F17210 Nicotine dependence, cigarettes, uncomplicated: Secondary | ICD-10-CM

## 2023-08-14 ENCOUNTER — Telehealth: Payer: Self-pay | Admitting: Family

## 2023-08-14 NOTE — Telephone Encounter (Signed)
 Reviewed recent CT chest and these were the findings:     There is atherosclerosis of the aorta which means there is plaquing which is likely from smoking and also cholesterol. This just means we need to closely watch the cholesterol and keep it under control.   There is noted emphysema in the lungs and inflammation of the lungs from smoking with mucous production. There are some pulmonary nodules but they appear benign per radiology.

## 2023-08-15 NOTE — Telephone Encounter (Signed)
 Left message to return call to our office.

## 2023-08-17 NOTE — Telephone Encounter (Signed)
 LM for pt to returncall

## 2023-08-19 ENCOUNTER — Other Ambulatory Visit: Payer: Self-pay | Admitting: Family

## 2023-08-19 DIAGNOSIS — I10 Essential (primary) hypertension: Secondary | ICD-10-CM

## 2023-08-20 NOTE — Telephone Encounter (Signed)
 LM for pt to returncall

## 2023-08-20 NOTE — Telephone Encounter (Signed)
 Called  pt and schedule a appt for f/u

## 2023-09-06 ENCOUNTER — Encounter: Payer: Self-pay | Admitting: Family

## 2023-09-06 ENCOUNTER — Ambulatory Visit: Admitting: Family

## 2023-09-06 ENCOUNTER — Ambulatory Visit: Payer: Self-pay | Admitting: Family

## 2023-09-06 VITALS — BP 138/88 | HR 57 | Temp 98.6°F | Ht 70.5 in | Wt 163.2 lb

## 2023-09-06 DIAGNOSIS — R809 Proteinuria, unspecified: Secondary | ICD-10-CM

## 2023-09-06 DIAGNOSIS — I10 Essential (primary) hypertension: Secondary | ICD-10-CM | POA: Diagnosis not present

## 2023-09-06 DIAGNOSIS — E782 Mixed hyperlipidemia: Secondary | ICD-10-CM | POA: Diagnosis not present

## 2023-09-06 DIAGNOSIS — Z23 Encounter for immunization: Secondary | ICD-10-CM

## 2023-09-06 DIAGNOSIS — Z720 Tobacco use: Secondary | ICD-10-CM

## 2023-09-06 DIAGNOSIS — J432 Centrilobular emphysema: Secondary | ICD-10-CM | POA: Insufficient documentation

## 2023-09-06 DIAGNOSIS — R718 Other abnormality of red blood cells: Secondary | ICD-10-CM

## 2023-09-06 LAB — COMPREHENSIVE METABOLIC PANEL WITH GFR
ALT: 26 U/L (ref 0–53)
AST: 35 U/L (ref 0–37)
Albumin: 4.7 g/dL (ref 3.5–5.2)
Alkaline Phosphatase: 80 U/L (ref 39–117)
BUN: 8 mg/dL (ref 6–23)
CO2: 30 meq/L (ref 19–32)
Calcium: 9.4 mg/dL (ref 8.4–10.5)
Chloride: 96 meq/L (ref 96–112)
Creatinine, Ser: 0.88 mg/dL (ref 0.40–1.50)
GFR: 97.31 mL/min (ref >60.00)
Glucose, Bld: 98 mg/dL (ref 70–99)
Potassium: 4 meq/L (ref 3.5–5.1)
Sodium: 135 meq/L (ref 135–145)
Total Bilirubin: 0.5 mg/dL (ref 0.2–1.2)
Total Protein: 7.6 g/dL (ref 6.0–8.3)

## 2023-09-06 LAB — CBC
HCT: 44.8 % (ref 39.0–52.0)
Hemoglobin: 15.2 g/dL (ref 13.0–17.0)
MCHC: 34 g/dL (ref 30.0–36.0)
MCV: 98.8 fl (ref 78.0–100.0)
Platelets: 215 10*3/uL (ref 150.0–400.0)
RBC: 4.53 Mil/uL (ref 4.22–5.81)
RDW: 13.7 % (ref 11.5–15.5)
WBC: 7.2 10*3/uL (ref 4.0–10.5)

## 2023-09-06 LAB — LIPID PANEL
Cholesterol: 156 mg/dL (ref 0–200)
HDL: 59.9 mg/dL (ref >39.00)
LDL Cholesterol: 74 mg/dL (ref 0–99)
NonHDL: 95.78
Total CHOL/HDL Ratio: 3
Triglycerides: 111 mg/dL (ref 0.0–149.0)
VLDL: 22.2 mg/dL (ref 0.0–40.0)

## 2023-09-06 LAB — MICROALBUMIN / CREATININE URINE RATIO
Creatinine,U: 64.6 mg/dL
Microalb Creat Ratio: 58.8 mg/g — ABNORMAL HIGH (ref 0.0–30.0)
Microalb, Ur: 3.8 mg/dL — ABNORMAL HIGH (ref 0.0–1.9)

## 2023-09-06 NOTE — Assessment & Plan Note (Signed)
 Smoking cessation instruction/counseling given:  counseled patient on the dangers of tobacco use, advised patient to stop smoking, and reviewed strategies to maximize success  Pt open to try to quit He would like to start with nicotine  patches, he has them at home. Going to pick a quit date.

## 2023-09-06 NOTE — Assessment & Plan Note (Signed)
 Ordered lipid panel, pending results. Work on low cholesterol diet and exercise as tolerated

## 2023-09-06 NOTE — Addendum Note (Signed)
 Addended by: Felicita Horns on: 09/06/2023 08:06 AM   Modules accepted: Level of Service

## 2023-09-06 NOTE — Progress Notes (Signed)
 Subjective:  Patient ID: John Peters, male    DOB: 1970/11/25  Age: 53 y.o. MRN: [id number]  Patient Care Team: Felicita Horns, FNP as PCP - General (Family Medicine)   CC:  Chief Complaint  Patient presents with   Medical Management of Chronic Issues    HPI John Peters is a 53 y.o. male who presents today for an annual physical exam. He reports consuming a general diet. The patient does not participate in regular exercise at present. He is physically active at work. He generally feels well. He reports sleeping well. He does have additional problems to discuss today.   Vision:Not within last year Dental:Receives regular dental care  Lung Cancer Screening with low-dose Chest CT: last was 06/2023 atherosclerosis on aorta, noted centrilobal and paraseptal emphysema, bronchiolitis and mucoid impaction. He has been thinking about quitting but he is working on a date to quit.   Colonoscopy:01/04/23 every three years   Pt is without acute concerns.   ------------------------------------  mMRC dyspnea scale, asked in office, on a scale 0-4  0I only get breathless with strenuous exercise  Have you had 2 or more moderate exacerbations or at least 1 hospitalization for COPD exacerbation in the past year? No   Does the patient have high peripheral eosinophil levels (>300 ): unknown, will order today.  ------------------------------------      Advanced Directives Patient does not have advanced directives    DEPRESSION SCREENING    09/07/2022    8:14 AM 09/01/2021    8:36 AM 10/30/2019    9:23 AM 07/24/2018   10:49 AM 09/20/2017   12:17 PM  PHQ 2/9 Scores  PHQ - 2 Score 0 3 0 0 0  PHQ- 9 Score 0 4        ROS: Negative unless specifically indicated above in HPI.    Current Outpatient Medications:    amLODipine  (NORVASC ) 5 MG tablet, TAKE 1 TABLET (5 MG TOTAL) BY MOUTH DAILY. MUST HAVE OV FOR FURTHER REFILLS, Disp: 90 tablet, Rfl: 0   aspirin EC 81 MG tablet, Take 81  mg by mouth daily., Disp: , Rfl:    atorvastatin  (LIPITOR) 10 MG tablet, TAKE 1 TABLET (10 MG TOTAL) BY MOUTH DAILY. MUST HAVE OV FOR FURTHER REFILLS, Disp: 90 tablet, Rfl: 0   lisinopril -hydrochlorothiazide  (ZESTORETIC ) 20-12.5 MG tablet, TAKE 1 TABLET BY MOUTH DAILY. MUST HAVE OV FOR FURTHER REFILLS, Disp: 90 tablet, Rfl: 0   Omega-3 Fatty Acids (FISH OIL) 1000 MG CAPS, Take one by mouth daily, Disp: , Rfl:    propranolol  (INDERAL ) 80 MG tablet, TAKE 1 TABLET (80 MG TOTAL) BY MOUTH 2 (TWO) TIMES DAILY. MUST HAVE OV FOR FURTHER REFILLS, Disp: 180 tablet, Rfl: 0   vitamin C (ASCORBIC ACID) 500 MG tablet, Take 500 mg by mouth daily., Disp: , Rfl:     Objective:    BP 138/88 (BP Location: Left Arm, Patient Position: Sitting, Cuff Size: Normal)   Pulse (!) 57   Temp 98.6 F (37 C) (Temporal)   Ht 5' 10.5" (1.791 m)   Wt 163 lb 3.2 oz (74 kg)   SpO2 96%   BMI 23.09 kg/m   BP Readings from Last 3 Encounters:  09/06/23 138/88  01/04/23 (!) 150/95  09/07/22 136/84      Physical Exam Vitals reviewed.  Constitutional:      General: He is not in acute distress.    Appearance: Normal appearance. He is normal weight. He is not ill-appearing, toxic-appearing or  diaphoretic.  HENT:     Head: Normocephalic.     Right Ear: Tympanic membrane normal.     Left Ear: Tympanic membrane normal.     Nose: Nose normal.     Mouth/Throat:     Mouth: Mucous membranes are moist.  Eyes:     Pupils: Pupils are equal, round, and reactive to light.  Cardiovascular:     Rate and Rhythm: Normal rate and regular rhythm.  Pulmonary:     Effort: Pulmonary effort is normal.     Breath sounds: Normal breath sounds.  Musculoskeletal:        General: Normal range of motion.  Skin:    General: Skin is warm.  Neurological:     General: No focal deficit present.     Mental Status: He is alert and oriented to person, place, and time. Mental status is at baseline.  Psychiatric:        Mood and Affect: Mood  normal.        Behavior: Behavior normal.        Thought Content: Thought content normal.        Judgment: Judgment normal.          Assessment & Plan:  Centrilobular emphysema (HCC) Assessment & Plan: Reviewed with pt.  On most recent CT.    Mixed hyperlipidemia Assessment & Plan: Ordered lipid panel, pending results. Work on low cholesterol diet and exercise as tolerated   Orders: -     Lipid panel  Tobacco abuse Assessment & Plan: Smoking cessation instruction/counseling given:  counseled patient on the dangers of tobacco use, advised patient to stop smoking, and reviewed strategies to maximize success  Pt open to try to quit He would like to start with nicotine  patches, he has them at home. Going to pick a quit date.    Primary hypertension Assessment & Plan: Pt advised of the following:  Continue medication as prescribed. Monitor blood pressure periodically and/or when you feel symptomatic. Goal is <130/90 on average. Ensure that you have rested for 30 minutes prior to checking your blood pressure. Record your readings and bring them to your next visit if necessary.work on a low sodium diet. Continue amlodipine  5 mg once daily, propanolol,  and also lisionpril hctz 20-12.5 mg once daily.   Orders: -     Comprehensive metabolic panel with GFR -     Microalbumin / creatinine urine ratio  Red blood cell abnormality -     CBC  Unable to charge for CPE today as pt is one day short.  Pt made aware.     Follow-up: Return in about 1 year (around 09/05/2024) for f/u CPE.   Felicita Horns, FNP

## 2023-09-06 NOTE — Addendum Note (Signed)
 Addended by: Dewanda Foots C on: 09/06/2023 08:14 AM   Modules accepted: Orders

## 2023-09-06 NOTE — Assessment & Plan Note (Signed)
 Pt advised of the following:  Continue medication as prescribed. Monitor blood pressure periodically and/or when you feel symptomatic. Goal is <130/90 on average. Ensure that you have rested for 30 minutes prior to checking your blood pressure. Record your readings and bring them to your next visit if necessary.work on a low sodium diet. Continue amlodipine  5 mg once daily, propanolol,  and also lisionpril hctz 20-12.5 mg once daily.

## 2023-09-06 NOTE — Assessment & Plan Note (Signed)
 Reviewed with pt.  On most recent CT.

## 2023-09-20 ENCOUNTER — Other Ambulatory Visit

## 2023-09-20 DIAGNOSIS — R809 Proteinuria, unspecified: Secondary | ICD-10-CM | POA: Diagnosis not present

## 2023-09-20 DIAGNOSIS — I10 Essential (primary) hypertension: Secondary | ICD-10-CM

## 2023-09-21 ENCOUNTER — Ambulatory Visit: Payer: Self-pay | Admitting: Family

## 2023-09-21 LAB — MICROALBUMIN / CREATININE URINE RATIO
Creatinine, Urine: 85 mg/dL (ref 20–320)
Microalb Creat Ratio: 16 mg/g{creat} (ref <30)
Microalb, Ur: 1.4 mg/dL

## 2023-11-18 ENCOUNTER — Other Ambulatory Visit: Payer: Self-pay | Admitting: Family

## 2023-11-18 DIAGNOSIS — I10 Essential (primary) hypertension: Secondary | ICD-10-CM

## 2024-05-16 ENCOUNTER — Other Ambulatory Visit: Payer: Self-pay | Admitting: Family

## 2024-05-16 DIAGNOSIS — I10 Essential (primary) hypertension: Secondary | ICD-10-CM
